# Patient Record
Sex: Female | Born: 1990
Health system: Southern US, Community
[De-identification: ages and names within clinical notes are randomized; demographics above are authoritative.]

## PROBLEM LIST (undated history)

## (undated) DIAGNOSIS — J45909 Unspecified asthma, uncomplicated: Secondary | ICD-10-CM

## (undated) DIAGNOSIS — D649 Anemia, unspecified: Secondary | ICD-10-CM

## (undated) HISTORY — PX: WISDOM TOOTH EXTRACTION: SHX21

## (undated) HISTORY — PX: NO PAST SURGERIES: SHX2092

---
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
## 2013-09-24 ENCOUNTER — Emergency Department (HOSPITAL_COMMUNITY): Payer: 59

## 2013-09-24 ENCOUNTER — Encounter (HOSPITAL_COMMUNITY): Payer: Self-pay | Admitting: Emergency Medicine

## 2013-09-24 ENCOUNTER — Emergency Department (HOSPITAL_COMMUNITY)
Admission: EM | Admit: 2013-09-24 | Discharge: 2013-09-24 | Disposition: A | Discharge status: Home / Self Care | Payer: 59 | Attending: Emergency Medicine | Admitting: Emergency Medicine

## 2013-09-24 DIAGNOSIS — Y9389 Activity, other specified: Secondary | ICD-10-CM | POA: Diagnosis not present

## 2013-09-24 DIAGNOSIS — S93401A Sprain of unspecified ligament of right ankle, initial encounter: Secondary | ICD-10-CM

## 2013-09-24 DIAGNOSIS — Y9289 Other specified places as the place of occurrence of the external cause: Secondary | ICD-10-CM | POA: Diagnosis not present

## 2013-09-24 DIAGNOSIS — S93409A Sprain of unspecified ligament of unspecified ankle, initial encounter: Secondary | ICD-10-CM | POA: Insufficient documentation

## 2013-09-24 DIAGNOSIS — S8990XA Unspecified injury of unspecified lower leg, initial encounter: Secondary | ICD-10-CM | POA: Insufficient documentation

## 2013-09-24 DIAGNOSIS — X500XXA Overexertion from strenuous movement or load, initial encounter: Secondary | ICD-10-CM | POA: Diagnosis not present

## 2013-09-24 DIAGNOSIS — S99919A Unspecified injury of unspecified ankle, initial encounter: Secondary | ICD-10-CM

## 2013-09-24 DIAGNOSIS — S99929A Unspecified injury of unspecified foot, initial encounter: Secondary | ICD-10-CM

## 2013-09-24 MED ORDER — HYDROCODONE-ACETAMINOPHEN 5-325 MG PO TABS
1.0000 | ORAL_TABLET | ORAL | Status: DC | PRN
Start: 2013-09-24 — End: 2014-11-24

## 2013-09-24 NOTE — ED Provider Notes (Signed)
CSN: [id number]     Arrival date & time 09/24/13  1529 History   First MD Initiated Contact with Patient 09/24/13 1719     Chief Complaint  Patient presents with  . Ankle Injury     (Consider location/radiation/quality/duration/timing/severity/associated sxs/prior Treatment) HPI  Kathryn Cardenas is a 23 y.o. female presenting with right ankle pain which occurred suddenly when the patient twisted her ankle while walking about 2 hours prior to arrival.  Pain is aching, constant and worse with palpation, movement and weight bearing.  The patient was able to weight bear immediately after the event.  There is no radiation of pain and the patient denies numbness distal to the injury site.  The patients treatment prior to arrival included ibuprofen with minimal improvement.  History reviewed. No pertinent past medical history. History reviewed. No pertinent past surgical history. No family history on file. History  Substance Use Topics  . Smoking status: Never Smoker   . Smokeless tobacco: Not on file  . Alcohol Use: No   OB History   Grav Para Term Preterm Abortions TAB SAB Ect Mult Living                 Review of Systems  Musculoskeletal: Positive for arthralgias and joint swelling.  Skin: Negative for wound.  Neurological: Negative for weakness and numbness.      Allergies  Review of patient's allergies indicates no known allergies.  Home Medications   Prior to Admission medications   Not on File   BP 145/90  Pulse 82  Temp(Src) 99.1 F (37.3 C) (Oral)  Resp 16  Ht 5\' 10"  (1.778 m)  Wt 255 lb (115.667 kg)  BMI 36.59 kg/m2  SpO2 99%  LMP 09/10/2013 Physical Exam  Nursing note and vitals reviewed. Constitutional: She appears well-developed and well-nourished.  HENT:  Head: Normocephalic.  Cardiovascular: Normal rate and intact distal pulses.  Exam reveals no decreased pulses.   Pulses:      Dorsalis pedis pulses are 2+ on the right side, and 2+ on the left  side.       Posterior tibial pulses are 2+ on the right side, and 2+ on the left side.  Musculoskeletal: She exhibits edema and tenderness.       Right ankle: She exhibits decreased range of motion, swelling and ecchymosis. She exhibits normal pulse. Tenderness. Lateral malleolus tenderness found. No head of 5th metatarsal and no proximal fibula tenderness found. Achilles tendon normal.  Neurological: She is alert. No sensory deficit.  Skin: Skin is warm, dry and intact.    ED Course  Procedures (including critical care time) Labs Review Labs Reviewed - No data to display  Imaging Review Dg Ankle Complete Right  09/24/2013   CLINICAL DATA:  Pain  EXAM: RIGHT ANKLE - COMPLETE 3+ VIEW  COMPARISON:  None.  FINDINGS: There is no evidence of fracture, dislocation, or joint effusion. There is no evidence of arthropathy or other focal bone abnormality. Soft tissues are unremarkable.  IMPRESSION: Negative.   Electronically Signed   By: Marlan Palau M.D.   On: 09/24/2013 16:43     EKG Interpretation None      MDM   Final diagnoses:  Ankle sprain, right, initial encounter   Patients labs and/or radiological studies were viewed and considered during the medical decision making and disposition process. Pt was fitted with aso,  Encouraged RICE.  Pt has crutches.  F/u with pcp in 7-10 days if not improved   Raynelle Fanning  Frady Taddeo, PA-C 09/24/13 1748

## 2013-09-24 NOTE — ED Notes (Signed)
Pt states she rolled her right ankle and [facility] 2 hours [facility].

## 2013-09-24 NOTE — Discharge Instructions (Signed)
Ankle Sprain °An ankle sprain is an injury to the strong, fibrous tissues (ligaments) that hold the bones of your ankle joint together.  °CAUSES °An ankle sprain is usually caused by a fall or by twisting your ankle. Ankle sprains most commonly occur when you step on the outer edge of your foot, and your ankle turns inward. People who participate in sports are more prone to these types of injuries.  °SYMPTOMS  °· Pain in your ankle. The pain may be present at rest or only when you are trying to stand or walk. °· Swelling. °· Bruising. Bruising may develop immediately or within 1 to 2 days after your injury. °· Difficulty standing or walking, particularly when turning corners or changing directions. °DIAGNOSIS  °Your caregiver will ask you details about your injury and perform a physical exam of your ankle to determine if you have an ankle sprain. During the physical exam, your caregiver will press on and apply pressure to specific areas of your foot and ankle. Your caregiver will try to move your ankle in certain ways. An X-ray exam may be done to be sure a bone was not broken or a ligament did not separate from one of the bones in your ankle (avulsion fracture).  °TREATMENT  °Certain types of braces can help stabilize your ankle. Your caregiver can make a recommendation for this. Your caregiver may recommend the use of medicine for pain. If your sprain is severe, your caregiver may refer you to a surgeon who helps to restore function to parts of your skeletal system (orthopedist) or a physical therapist. °HOME CARE INSTRUCTIONS  °· Apply ice to your injury for 1-2 days or as directed by your caregiver. Applying ice helps to reduce inflammation and pain. °¨ Put ice in a plastic bag. °¨ Place a towel between your skin and the bag. °¨ Leave the ice on for 15-20 minutes at a time, every 2 hours while you are awake. °· Only take over-the-counter or prescription medicines for pain, discomfort, or fever as directed by  your caregiver. °· Elevate your injured ankle above the level of your heart as much as possible for 2-3 days. °· If your caregiver recommends crutches, use them as instructed. Gradually put weight on the affected ankle. Continue to use crutches or a cane until you can walk without feeling pain in your ankle. °· If you have a plaster splint, wear the splint as directed by your caregiver. Do not rest it on anything harder than a pillow for the first 24 hours. Do not put weight on it. Do not get it wet. You may take it off to take a shower or bath. °· You may have been given an elastic bandage to wear around your ankle to provide support. If the elastic bandage is too tight (you have numbness or tingling in your foot or your foot becomes cold and blue), adjust the bandage to make it comfortable. °· If you have an air splint, you may blow more air into it or let air out to make it more comfortable. You may take your splint off at night and before taking a shower or bath. Wiggle your toes in the splint several times per day to decrease swelling. °SEEK MEDICAL CARE IF:  °· You have rapidly increasing bruising or swelling. °· Your toes feel extremely cold or you lose feeling in your foot. °· Your pain is not relieved with medicine. °SEEK IMMEDIATE MEDICAL CARE IF: °· Your toes are numb or blue. °·   You have severe pain that is increasing. MAKE SURE YOU:   Understand these instructions.  Will watch your condition.  Will get help right away if you are not doing well or get worse. Document Released: 01/23/2005 Document Revised: 10/18/2011 Document Reviewed: 02/04/2011 [facility] Patient Information 2015 [employer], [place]. This information is not intended to replace advice given to you by your health care provider. Make sure you discuss any questions you have with your health care provider.   Wear the ASO and use your crutches to avoid weight bearing until you can tolerate bearing weight without increased pain or  swelling.  Use ice and elevation as much as possible for the next several days to help reduce the swelling.    Use  Ibuprofen 600 mg (3 tablets of the 200mg  over the counter tablets) for  inflammation.  Call your doctor for a recheck of your injury in 7-10 days if your symptoms are not improving by then.  You may benefit from physical therapy of your ankle if it is not getting better over the next week.

## 2013-09-26 NOTE — ED Provider Notes (Signed)
Medical screening examination/treatment/procedure(s) were performed by non-physician practitioner and as supervising physician I was immediately available for consultation/collaboration.   EKG Interpretation None        Vanetta MuldersScott Sundeep Cary, MD 09/26/13 1549

## 2014-11-24 ENCOUNTER — Encounter (HOSPITAL_COMMUNITY): Payer: Self-pay | Admitting: *Deleted

## 2014-11-24 ENCOUNTER — Emergency Department (HOSPITAL_COMMUNITY): Payer: 59

## 2014-11-24 ENCOUNTER — Emergency Department (HOSPITAL_COMMUNITY)
Admission: EM | Admit: 2014-11-24 | Discharge: 2014-11-24 | Disposition: A | Discharge status: Home / Self Care | Payer: 59 | Attending: Emergency Medicine | Admitting: Emergency Medicine

## 2014-11-24 DIAGNOSIS — S6992XA Unspecified injury of left wrist, hand and finger(s), initial encounter: Secondary | ICD-10-CM | POA: Insufficient documentation

## 2014-11-24 DIAGNOSIS — Z3202 Encounter for pregnancy test, result negative: Secondary | ICD-10-CM | POA: Insufficient documentation

## 2014-11-24 DIAGNOSIS — S4992XA Unspecified injury of left shoulder and upper arm, initial encounter: Secondary | ICD-10-CM | POA: Insufficient documentation

## 2014-11-24 DIAGNOSIS — Y92410 Unspecified street and highway as the place of occurrence of the external cause: Secondary | ICD-10-CM | POA: Diagnosis not present

## 2014-11-24 DIAGNOSIS — Y998 Other external cause status: Secondary | ICD-10-CM | POA: Diagnosis not present

## 2014-11-24 DIAGNOSIS — Y9384 Activity, sleeping: Secondary | ICD-10-CM | POA: Insufficient documentation

## 2014-11-24 DIAGNOSIS — M79602 Pain in left arm: Secondary | ICD-10-CM

## 2014-11-24 DIAGNOSIS — S59902A Unspecified injury of left elbow, initial encounter: Secondary | ICD-10-CM | POA: Diagnosis not present

## 2014-11-24 DIAGNOSIS — J45909 Unspecified asthma, uncomplicated: Secondary | ICD-10-CM | POA: Diagnosis not present

## 2014-11-24 HISTORY — DX: Unspecified asthma, uncomplicated: J45.909

## 2014-11-24 LAB — POC URINE PREG, ED: Preg Test, Ur: NEGATIVE

## 2014-11-24 MED ORDER — IBUPROFEN 800 MG PO TABS: 800.0000 mg | ORAL_TABLET | Freq: Three times a day (TID) | ORAL | Status: DC

## 2014-11-24 MED ORDER — METHOCARBAMOL 500 MG PO TABS: 500.0000 mg | ORAL_TABLET | Freq: Three times a day (TID) | ORAL | Status: DC
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
## 2015-02-18 ENCOUNTER — Encounter: Payer: Self-pay | Admitting: Nurse Practitioner

## 2015-02-18 ENCOUNTER — Ambulatory Visit (INDEPENDENT_AMBULATORY_CARE_PROVIDER_SITE_OTHER): Payer: 59 | Admitting: Nurse Practitioner

## 2015-02-18 VITALS — BP 142/88 | Ht 70.0 in | Wt 257.0 lb

## 2015-02-18 DIAGNOSIS — J4520 Mild intermittent asthma, uncomplicated: Secondary | ICD-10-CM

## 2015-02-19 ENCOUNTER — Encounter: Payer: Self-pay | Admitting: Nurse Practitioner

## 2015-02-19 DIAGNOSIS — J4520 Mild intermittent asthma, uncomplicated: Secondary | ICD-10-CM | POA: Insufficient documentation
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
## 2015-03-09 DIAGNOSIS — H52229 Regular astigmatism, unspecified eye: Secondary | ICD-10-CM | POA: Diagnosis not present

## 2015-07-23 ENCOUNTER — Ambulatory Visit (INDEPENDENT_AMBULATORY_CARE_PROVIDER_SITE_OTHER): Payer: 59 | Admitting: Nurse Practitioner

## 2015-07-23 ENCOUNTER — Encounter: Payer: Self-pay | Admitting: Nurse Practitioner

## 2015-07-23 VITALS — BP 128/88 | Ht 70.0 in | Wt 264.2 lb

## 2015-07-23 DIAGNOSIS — N926 Irregular menstruation, unspecified: Secondary | ICD-10-CM | POA: Diagnosis not present

## 2015-07-23 MED ORDER — ALBUTEROL SULFATE HFA 108 (90 BASE) MCG/ACT IN AERS: 2.0000 | INHALATION_SPRAY | RESPIRATORY_TRACT | Status: DC | PRN

## 2015-07-23 NOTE — Progress Notes (Signed)
Subjective:  Presents for complaints of irregular menstrual cycles. Has been at least every other month. Never goes more than 2 months. Had a normal menstrual cycle June 10-14. Is not sexually active, defers need for contraceptive. Normal flow. No excessive cramping.  Objective:   BP 128/88 mmHg  Ht 5\' 10"  (1.778 m)  Wt 264 lb 4 oz (119.863 kg)  BMI 37.92 kg/m2 NAD. Alert, oriented. Lungs clear. Heart regular rate rhythm.  Assessment: Irregular menses  Plan: Since patient does not require contraceptive, denies symptoms of menorrhagia and dysmenorrhea, and cycles are occurring at least every other month further intervention is optional. This was discussed with patient. At this time she elects not to take any form of hormones. To call back if any problems. Also strongly encouraged preventive health physical.

## 2015-07-27 MED FILL — PROVENTIL HFA 90 MCG INH: 108 (90 BAS | 25 days supply | Qty: 7 | Fill #0
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
## 2018-08-17 ENCOUNTER — Other Ambulatory Visit: Payer: Self-pay | Admitting: Nurse Practitioner

## 2018-09-09 ENCOUNTER — Encounter: Payer: Self-pay | Admitting: Family Medicine

## 2018-09-09 MED ORDER — ALBUTEROL SULFATE HFA 108 (90 BASE) MCG/ACT IN AERS: 2.0000 | INHALATION_SPRAY | Freq: Four times a day (QID) | RESPIRATORY_TRACT | 0 refills | Status: DC | PRN

## 2018-09-09 NOTE — Telephone Encounter (Signed)
May have 1 refill of albuterol Please keep follow-up visit

## 2018-09-09 NOTE — Addendum Note (Signed)
Addended by: Dairl Ponder on: 09/09/2018 04:46 PM   Modules accepted: Orders

## 2018-10-25 ENCOUNTER — Other Ambulatory Visit: Payer: Self-pay | Admitting: *Deleted

## 2018-10-31 ENCOUNTER — Encounter: Payer: Self-pay | Admitting: Nurse Practitioner

## 2018-11-07 ENCOUNTER — Other Ambulatory Visit: Payer: Self-pay

## 2018-11-08 ENCOUNTER — Encounter: Payer: Self-pay | Admitting: Nurse Practitioner

## 2018-11-08 ENCOUNTER — Ambulatory Visit (INDEPENDENT_AMBULATORY_CARE_PROVIDER_SITE_OTHER): Payer: No Typology Code available for payment source | Admitting: Nurse Practitioner

## 2018-11-08 VITALS — BP 126/86 | Temp 97.6°F | Ht 68.5 in | Wt 252.8 lb

## 2018-11-08 DIAGNOSIS — Z23 Encounter for immunization: Secondary | ICD-10-CM

## 2018-11-08 DIAGNOSIS — Z124 Encounter for screening for malignant neoplasm of cervix: Secondary | ICD-10-CM

## 2018-11-08 DIAGNOSIS — Z1159 Encounter for screening for other viral diseases: Secondary | ICD-10-CM

## 2018-11-08 DIAGNOSIS — L83 Acanthosis nigricans: Secondary | ICD-10-CM

## 2018-11-08 DIAGNOSIS — Z01419 Encounter for gynecological examination (general) (routine) without abnormal findings: Secondary | ICD-10-CM

## 2018-11-08 DIAGNOSIS — Z113 Encounter for screening for infections with a predominantly sexual mode of transmission: Secondary | ICD-10-CM

## 2018-11-08 DIAGNOSIS — Z114 Encounter for screening for human immunodeficiency virus [HIV]: Secondary | ICD-10-CM

## 2018-11-08 MED ORDER — LO LOESTRIN FE 1 MG-10 MCG / 10 MCG PO TABS: 1.0000 | ORAL_TABLET | Freq: Every day | ORAL | 11 refills | Status: DC

## 2018-11-08 NOTE — Progress Notes (Signed)
Subjective:    Patient ID: Kathryn Cardenas, female    DOB: November 11, 1990, 28 y.o.   MRN: [id number]  HPI The patient comes in today for a wellness visit. Patient has no specific concerns. Pt got a new gym membership, trying to start exercising more. Has decreased her sugar intake, drinking more water. Sleeps 6-7 hours per day. Pt has gain some weight in the past few months. Pt having irregular cycles, has cycles every 1-2 months. No menorrhagia. LMP was mid-August. Pt is sexually active with one sexual partner. Uses condoms for birth control and STI protection. Regular eye and dental exam. Would like flu vaccine today.    A review of their health history was completed. A review of medications was also completed.  Any needed refills; none  Falls/  MVA accidents in past few months: no   Specialist pt sees on regular basis: none  Preventative health issues were discussed.   Additional concerns: none  Drug/Alcohol: Denies tobacco use. Drink 1 glass of alcohol during the weekend.   Review of Systems  Constitutional: Negative for activity change, appetite change, chills and fever.  HENT: Negative for ear pain, sinus pain and sneezing.   Respiratory: Negative for cough, shortness of breath and wheezing.   Cardiovascular: Negative for chest pain, palpitations and leg swelling.  Gastrointestinal: Negative for abdominal pain, blood in stool, diarrhea and vomiting.  Genitourinary: Positive for menstrual problem. Negative for difficulty urinating, dysuria, frequency, pelvic pain, vaginal bleeding and vaginal discharge.  Musculoskeletal: Negative for back pain and myalgias.  Neurological: Negative for dizziness, syncope and weakness.  Psychiatric/Behavioral: Negative for confusion.   Depression screen PHQ 2/9 11/08/2018  Decreased Interest 2  Down, Depressed, Hopeless 0  PHQ - 2 Score 2  Altered sleeping 0  Tired, decreased energy 1  Change in appetite 0  Feeling bad or failure about  yourself  0  Trouble concentrating 0  Moving slowly or fidgety/restless 0  Suicidal thoughts 0  PHQ-9 Score 3  Difficult doing work/chores Not difficult at all       Objective:   Physical Exam Exam conducted with a chaperone present.  Constitutional:      Appearance: Normal appearance.  Neck:     Thyroid: No thyroid mass or thyromegaly.  Cardiovascular:     Rate and Rhythm: Normal rate and regular rhythm.     Heart sounds: No murmur.  Pulmonary:     Effort: No respiratory distress.     Breath sounds: Normal breath sounds. No wheezing.  Chest:     Chest wall: No mass, lacerations or swelling.     Breasts:        Right: Normal. No swelling, bleeding, inverted nipple or mass.        Left: Normal. No swelling, bleeding, inverted nipple or mass.     Comments: Dense breast tissue Abdominal:     General: Abdomen is flat.     Palpations: There is no mass.     Tenderness: There is no abdominal tenderness. There is no rebound.     Hernia: No hernia is present.  Genitourinary:    Exam position: Lithotomy position.     Comments: External: No rashes or lesions present. Internal: No rashes or lesions present. Vagina has slight milky, clear discharge. No CMT. No palpable masses during bimanual exam. Exam limited due to abdominal girth. Lymphadenopathy:     Upper Body:     Right upper body: No supraclavicular, axillary or pectoral adenopathy.  Left upper body: No supraclavicular, axillary or pectoral adenopathy.  Skin:    General: Skin is warm and dry.     Comments: Slight darkening in the back of the neck  Neurological:     Mental Status: She is alert and oriented to person, place, and time.  Psychiatric:        Mood and Affect: Mood normal.        Behavior: Behavior normal.           Assessment & Plan:   Problem List Items Addressed This Visit      Musculoskeletal and Integument   Acanthosis nigricans     Other   Morbid obesity ([place])    Other Visit Diagnoses     Well woman exam    -  Primary   Relevant Orders   Pap IG and Chlamydia/Gonococcus, NAA   COMPLETE METABOLIC PANEL WITH GFR   Lipid Profile (Completed)   Hemoglobin A1c (Completed)   RPR (Completed)   Hepatitis C Antibody (Completed)   Flu Vaccine QUAD 6+ mos PF IM (Fluarix Quad PF) (Completed)   Screening for cervical cancer       Relevant Orders   Pap IG and Chlamydia/Gonococcus, NAA   Routine screening for STI (sexually transmitted infection)       Relevant Orders   Pap IG and Chlamydia/Gonococcus, NAA   HIV antibody (with reflex) (Completed)   COMPLETE METABOLIC PANEL WITH GFR   Lipid Profile (Completed)   Hemoglobin A1c (Completed)   RPR (Completed)   Hepatitis C Antibody (Completed)   Flu Vaccine QUAD 6+ mos PF IM (Fluarix Quad PF) (Completed)   Need for vaccination       Relevant Orders   Flu Vaccine QUAD 6+ mos PF IM (Fluarix Quad PF) (Completed)   Screening for HIV (human immunodeficiency virus)       Need for hepatitis C screening test       Relevant Orders   Hepatitis C Antibody (Completed)      -Recommended continue dietary and lifestyle modifications. Discussed safe sex practices. Start low-dose birth control to help control irregular menstrual cycles. Discussed risks associated with oc use.  Labs ordered. Further follow up based on results.   Return in about 1 year (around 11/08/2019) for physical.

## 2018-11-08 NOTE — Progress Notes (Signed)
   Subjective:    Patient ID: Kathryn Cardenas, female    DOB: December 14, 1990, 28 y.o.   MRN: [id number]  HPI The patient comes in today for a wellness visit.    A review of their health history was completed.  A review of medications was also completed.  Any needed refills; Albuterol   Eating habits: trying to be health conscious   Falls/  MVA accidents in past few months: no  Regular exercise:  Trying to   Specialist pt sees on regular basis: none  Preventative health issues were discussed.   Additional concerns: none   Review of Systems     Objective:   Physical Exam        Assessment & Plan:

## 2018-11-09 ENCOUNTER — Encounter: Payer: Self-pay | Admitting: Nurse Practitioner

## 2018-11-09 DIAGNOSIS — L83 Acanthosis nigricans: Secondary | ICD-10-CM | POA: Insufficient documentation

## 2018-11-09 LAB — HIV ANTIBODY (ROUTINE TESTING W REFLEX): HIV Screen 4th Generation wRfx: NONREACTIVE

## 2018-11-11 LAB — LIPID PANEL
Chol/HDL Ratio: 4.9 ratio — ABNORMAL HIGH (ref 0.0–4.4)
Cholesterol, Total: 196 mg/dL (ref 100–199)
HDL: 40 mg/dL (ref >39)
LDL Chol Calc (NIH): 129 mg/dL — ABNORMAL HIGH (ref 0–99)
Triglycerides: 150 mg/dL — ABNORMAL HIGH (ref 0–149)
VLDL Cholesterol Cal: 27 mg/dL (ref 5–40)

## 2018-11-11 LAB — RPR: RPR Ser Ql: NONREACTIVE

## 2018-11-11 LAB — HEMOGLOBIN A1C
Est. average glucose Bld gHb Est-mCnc: 108 mg/dL
Hgb A1c MFr Bld: 5.4 % (ref 4.8–5.6)

## 2018-11-11 LAB — HEPATITIS C ANTIBODY: Hep C Virus Ab: 0.1 s/co ratio (ref 0.0–0.9)

## 2018-11-14 ENCOUNTER — Encounter: Payer: Self-pay | Admitting: Nurse Practitioner

## 2018-11-15 ENCOUNTER — Other Ambulatory Visit: Payer: Self-pay | Admitting: Nurse Practitioner

## 2018-11-15 LAB — PAP IG AND CT-NG NAA
Chlamydia, Nuc. Acid Amp: NEGATIVE
Gonococcus by Nucleic Acid Amp: NEGATIVE

## 2018-11-15 MED ORDER — ALBUTEROL SULFATE HFA 108 (90 BASE) MCG/ACT IN AERS: 2.0000 | INHALATION_SPRAY | Freq: Four times a day (QID) | RESPIRATORY_TRACT | 0 refills | Status: DC | PRN

## 2018-11-19 ENCOUNTER — Encounter: Payer: Self-pay | Admitting: Nurse Practitioner

## 2018-11-20 ENCOUNTER — Other Ambulatory Visit: Payer: Self-pay | Admitting: Nurse Practitioner

## 2018-11-20 MED ORDER — NORETHINDRONE ACET-ETHINYL EST 1-20 MG-MCG PO TABS: 1.0000 | ORAL_TABLET | Freq: Every day | ORAL | 11 refills | Status: DC

## 2018-12-04 ENCOUNTER — Encounter: Payer: Self-pay | Admitting: Nurse Practitioner

## 2019-02-10 ENCOUNTER — Encounter: Payer: Self-pay | Admitting: Nurse Practitioner

## 2019-02-11 ENCOUNTER — Other Ambulatory Visit: Payer: 59

## 2019-02-11 NOTE — Telephone Encounter (Signed)
If the patient is not having any pain or discomfort this is not unusual.  But I would recommend that the patient give [place] another update in 4 to 6 weeks on how the next cycle goes Certainly if starting to have discomfort or pelvic pain to do a follow-up visit

## 2019-02-12 ENCOUNTER — Other Ambulatory Visit: Payer: Self-pay

## 2019-02-12 ENCOUNTER — Ambulatory Visit: Payer: No Typology Code available for payment source | Attending: Internal Medicine

## 2019-02-12 DIAGNOSIS — Z20822 Contact with and (suspected) exposure to covid-19: Secondary | ICD-10-CM | POA: Insufficient documentation

## 2019-02-14 LAB — NOVEL CORONAVIRUS, NAA: SARS-CoV-2, NAA: NOT DETECTED

## 2019-03-19 ENCOUNTER — Encounter: Payer: Self-pay | Admitting: Family Medicine

## 2019-03-19 ENCOUNTER — Other Ambulatory Visit: Payer: Self-pay

## 2019-03-19 ENCOUNTER — Ambulatory Visit (INDEPENDENT_AMBULATORY_CARE_PROVIDER_SITE_OTHER): Payer: No Typology Code available for payment source | Admitting: Family Medicine

## 2019-03-19 VITALS — BP 132/88 | Temp 98.2°F | Ht 69.0 in | Wt 244.0 lb

## 2019-03-19 DIAGNOSIS — K921 Melena: Secondary | ICD-10-CM | POA: Diagnosis not present

## 2019-03-19 DIAGNOSIS — D509 Iron deficiency anemia, unspecified: Secondary | ICD-10-CM

## 2019-03-19 LAB — POCT HEMOGLOBIN: Hemoglobin: 10.5 g/dL — AB (ref 11–14.6)

## 2019-03-19 NOTE — Progress Notes (Signed)
   Subjective:    Patient ID: Kathryn Cardenas, female    DOB: 1990/08/09, 29 y.o.   MRN: [id number]  HPIpt noticed blood on paper when wiping 6 days after having a bm. Then happened again yesterday and this morning.  Patient stated never had this before denies any pain for has intermittent hard bowel movements patient denies fatigue tiredness Results for orders placed or performed in visit on 03/19/19  POCT hemoglobin  Result Value Ref Range   Hemoglobin 10.5 (A) 11 - 14.6 g/dL   T rooReview of Systems    Denies abdominal pain does relate blood in stool intermittently the past couple days denies fevers chills vomiting denies shortness of breath or fatigue tiredness Objective:   Physical Exam Lungs clear respiratory normal heart regular no murmurs abdomen soft no guarding rebound tenderness rectal exam is normal  Heme-negative     Assessment & Plan:  Anemia check lab work Blood in stool referral to GI may will need scope I doubt colon cancer but it could be a colitis issue or possibly internal hemorrhoids gave patient print out on fiber in the diet encourage more vegetable fruit and water

## 2019-03-19 NOTE — Telephone Encounter (Signed)
Tried to call and no answer.  

## 2019-03-19 NOTE — Telephone Encounter (Signed)
Pt Called back and scheduled appt for today

## 2019-03-20 LAB — IRON AND TIBC
Iron Saturation: 10 % — ABNORMAL LOW (ref 15–55)
Iron: 38 ug/dL (ref 27–159)
Total Iron Binding Capacity: 394 ug/dL (ref 250–450)
UIBC: 356 ug/dL (ref 131–425)

## 2019-03-20 LAB — CBC WITH DIFFERENTIAL/PLATELET
Basophils Absolute: 0 10*3/uL (ref 0.0–0.2)
Basos: 1 %
EOS (ABSOLUTE): 0.3 10*3/uL (ref 0.0–0.4)
Eos: 6 %
Hematocrit: 33.9 % — ABNORMAL LOW (ref 34.0–46.6)
Hemoglobin: 10.6 g/dL — ABNORMAL LOW (ref 11.1–15.9)
Immature Grans (Abs): 0 10*3/uL (ref 0.0–0.1)
Immature Granulocytes: 0 %
Lymphocytes Absolute: 1.7 10*3/uL (ref 0.7–3.1)
Lymphs: 35 %
MCH: 26.8 pg (ref 26.6–33.0)
MCHC: 31.3 g/dL — ABNORMAL LOW (ref 31.5–35.7)
MCV: 86 fL (ref 79–97)
Monocytes Absolute: 0.5 10*3/uL (ref 0.1–0.9)
Monocytes: 11 %
Neutrophils Absolute: 2.3 10*3/uL (ref 1.4–7.0)
Neutrophils: 47 %
Platelets: 316 10*3/uL (ref 150–450)
RBC: 3.96 x10E6/uL (ref 3.77–5.28)
RDW: 14.7 % (ref 11.7–15.4)
WBC: 4.9 10*3/uL (ref 3.4–10.8)

## 2019-03-20 LAB — FERRITIN: Ferritin: 9 ng/mL — ABNORMAL LOW (ref 15–150)

## 2019-03-20 LAB — C-REACTIVE PROTEIN: CRP: 8 mg/L (ref 0–10)

## 2019-03-25 MED FILL — predniSONE 10 MG TABS: 10 | 5 days supply | Qty: 20 | Fill #0

## 2019-04-04 ENCOUNTER — Encounter: Payer: Self-pay | Admitting: Family Medicine

## 2019-04-08 ENCOUNTER — Encounter: Payer: Self-pay | Admitting: Internal Medicine

## 2019-05-06 ENCOUNTER — Ambulatory Visit: Payer: Self-pay | Admitting: Nurse Practitioner

## 2019-05-06 NOTE — Progress Notes (Deleted)
Primary Care Physician:  Babs Sciara, MD Primary Gastroenterologist:  Dr.   Bonnetta Barry chief complaint on file.   HPI:   Kathryn Cardenas is a 29 y.o. female who presents on referral from primary care for blood in stool and iron deficiency anemia.  Reviewed information provided with referral including office visit dated 03/19/2019.  At that time she denied abdominal pain but did have blood in the stool intermittently over the last couple days.  This was on the paper when wiping for least a couple episodes.  Never had rectal bleeding before, has intermittent hard bowel movements.  No overt fatigue.  Point-of-care hemoglobin found to be 10.5.  Abdominal exam essentially normal.  Recommended referral to GI for endoscopic evaluation.  No history of colonoscopy found in our system.  Today she states   Past Medical History:  Diagnosis Date  . Asthma     No past surgical history on file.  Current Outpatient Medications  Medication Sig Dispense Refill  . albuterol (VENTOLIN HFA) 108 (90 Base) MCG/ACT inhaler Inhale 2 puffs into the lungs every 6 (six) hours as needed. 18 g 0  . norethindrone-ethinyl estradiol (LOESTRIN) 1-20 MG-MCG tablet Take 1 tablet by mouth daily. 1 Package 11   No current facility-administered medications for this visit.    Allergies as of 05/06/2019 - Review Complete 11/09/2018  Allergen Reaction Noted  . Ibuprofen Hives 03/19/2019    No family history on file.  Social History   Socioeconomic History  . Marital status: Single    Spouse name: Not on file  . Number of children: Not on file  . Years of education: Not on file  . Highest education level: Not on file  Occupational History  . Not on file  Tobacco Use  . Smoking status: Never Smoker  . Smokeless tobacco: Never Used  Substance and Sexual Activity  . Alcohol use: Yes    Alcohol/week: 1.0 standard drinks    Types: 1 Standard drinks or equivalent per week    Comment: social drinker  . Drug  use: No  . Sexual activity: Yes    Birth control/protection: Condom  Other Topics Concern  . Not on file  Social History Narrative  . Not on file   Social Determinants of Health   Financial Resource Strain:   . Difficulty of Paying Living Expenses:   Food Insecurity:   . Worried About Programme researcher, broadcasting/film/video in the Last Year:   . Barista in the Last Year:   Transportation Needs:   . Freight forwarder (Medical):   Marland Kitchen Lack of Transportation (Non-Medical):   Physical Activity:   . Days of Exercise per Week:   . Minutes of Exercise per Session:   Stress:   . Feeling of Stress :   Social Connections:   . Frequency of Communication with Friends and Family:   . Frequency of Social Gatherings with Friends and Family:   . Attends Religious Services:   . Active Member of Clubs or Organizations:   . Attends Banker Meetings:   Marland Kitchen Marital Status:   Intimate Partner Violence:   . Fear of Current or Ex-Partner:   . Emotionally Abused:   Marland Kitchen Physically Abused:   . Sexually Abused:     Review of Systems: General: Negative for anorexia, weight loss, fever, chills, fatigue, weakness. Eyes: Negative for vision changes.  ENT: Negative for hoarseness, difficulty swallowing , nasal congestion. CV: Negative for chest pain,  angina, palpitations, dyspnea on exertion, peripheral edema.  Respiratory: Negative for dyspnea at rest, dyspnea on exertion, cough, sputum, wheezing.  GI: See history of present illness. GU:  Negative for dysuria, hematuria, urinary incontinence, urinary frequency, nocturnal urination.  MS: Negative for joint pain, low back pain.  Derm: Negative for rash or itching.  Neuro: Negative for weakness, abnormal sensation, seizure, frequent headaches, memory loss, confusion.  Psych: Negative for anxiety, depression, suicidal ideation, hallucinations.  Endo: Negative for unusual weight change.  Heme: Negative for bruising or bleeding. Allergy: Negative for  rash or hives.    Physical Exam: There were no vitals taken for this visit. General:   Alert and oriented. Pleasant and cooperative. Well-nourished and well-developed.  Head:  Normocephalic and atraumatic. Eyes:  Without icterus, sclera clear and conjunctiva pink.  Ears:  Normal auditory acuity. Mouth:  No deformity or lesions, oral mucosa pink.  Throat/Neck:  Supple, without mass or thyromegaly. Cardiovascular:  S1, S2 present without murmurs appreciated. Normal pulses noted. Extremities without clubbing or edema. Respiratory:  Clear to auscultation bilaterally. No wheezes, rales, or rhonchi. No distress.  Gastrointestinal:  +BS, soft, non-tender and non-distended. No HSM noted. No guarding or rebound. No masses appreciated.  Rectal:  Deferred  Musculoskalatal:  Symmetrical without gross deformities. Normal posture. Skin:  Intact without significant lesions or rashes. Neurologic:  Alert and oriented x4;  grossly normal neurologically. Psych:  Alert and cooperative. Normal mood and affect. Heme/Lymph/Immune: No significant cervical adenopathy. No excessive bruising noted.    05/06/2019 7:57 AM   Disclaimer: This note was dictated with voice recognition software. Similar sounding words can inadvertently be transcribed and may not be corrected upon review.

## 2019-05-13 ENCOUNTER — Encounter: Payer: Self-pay | Admitting: Nurse Practitioner

## 2019-05-13 ENCOUNTER — Ambulatory Visit: Payer: No Typology Code available for payment source | Admitting: Nurse Practitioner

## 2019-05-13 ENCOUNTER — Other Ambulatory Visit: Payer: Self-pay

## 2019-05-13 ENCOUNTER — Encounter: Payer: Self-pay | Admitting: *Deleted

## 2019-05-13 ENCOUNTER — Telehealth: Payer: Self-pay | Admitting: *Deleted

## 2019-05-13 ENCOUNTER — Ambulatory Visit: Payer: Self-pay | Admitting: Nurse Practitioner

## 2019-05-13 DIAGNOSIS — D649 Anemia, unspecified: Secondary | ICD-10-CM | POA: Insufficient documentation

## 2019-05-13 DIAGNOSIS — K625 Hemorrhage of anus and rectum: Secondary | ICD-10-CM | POA: Diagnosis not present

## 2019-05-13 MED ORDER — CLENPIQ 10-3.5-12 MG-GM -GM/160ML PO SOLN: 1.0000 | Freq: Once | ORAL | 0 refills | Status: AC

## 2019-05-13 NOTE — Assessment & Plan Note (Signed)
The patient had daily to every other day hematochezia in February for bowel 1 week, described as a little bit to moderate.  No constipation associated or rectal symptoms.  She is noted to be anemic as per below.  At this point given rectal bleeding we will proceed with a colonoscopy to further evaluate.  Differentials include benign anorectal source such as hemorrhoids, colitis, bleeding polyp, much less likely colorectal cancer.  Further recommendations to follow.  Proceed with TCS on propofol/MAC with Dr. Gala Romney in near future: the risks, benefits, and alternatives have been discussed with the patient in detail. The patient states understanding and desires to proceed.  The patient is not on any anticoagulants, anxiolytics, chronic pain medications, antidepressants, antidiabetics, or iron supplements.  Social alcohol, denies recreational drugs.  She is quite young age.  We will plan for the procedure on propofol/MAC to promote adequate sedation.

## 2019-05-13 NOTE — Telephone Encounter (Signed)
Called [facility] and spoke with Greig Castilla. PA required. I need to fax clinicals to [phone]. Clinicals faxed.

## 2019-05-13 NOTE — Patient Instructions (Addendum)
Your health issues we discussed today were:   Rectal bleeding with anemia: 1. We will schedule your colonoscopy for you 2. Further recommendations will be made based on the results of your colonoscopy 3. Continue your iron other than 1 week prior to your colonoscopy (as per your colonoscopy prep instructions) 4. Call us if you have any worsening or significant bleeding  Overall I recommend:  1. Continue your other current medications 2. Return for follow-up in 3 months 3. Call us if you have any questions or concerns   ---------------------------------------------------------------  COVID-19 Vaccine Information can be found at: [email or web address] For questions related to vaccine distribution or appointments, please email [email or web address] or call [phone].   ---------------------------------------------------------------   At [facility] we value your feedback. You may receive a survey about your visit today. Please share your experience as we strive to create trusting relationships with our patients to provide genuine, compassionate, quality care.  We appreciate your understanding and patience as we review any laboratory studies, imaging, and other diagnostic tests that are ordered as we care for you. Our office policy is 5 business days for review of these results, and any emergent or urgent results are addressed in a timely manner for your best interest. If you do not hear from our office in 1 week, please contact us.   We also encourage the use of MyChart, which contains your medical information for your review as well. If you are not enrolled in this feature, an access code is on this after visit summary for your convenience. Thank you for allowing [place] to be involved in your care.  It was great to see you today!  I hope you have a great summer!!

## 2019-05-13 NOTE — Progress Notes (Signed)
Primary Care Physician:  Babs Sciara, MD Primary Gastroenterologist:  Dr. Jena Gauss  Chief Complaint  Patient presents with  . Blood In Stools    hasn't seen any blood since February  . Anemia    HPI:   Kathryn Cardenas is a 29 y.o. female who presents on referral from primary care for blood in stool and IDA.  Reviewed information associated with referral including office visit dated 03/19/2019 for IDA and blood in stool.  At that time she noted intermittent hematochezia for the previous couple days without abdominal pain or other overt GI complaints.  No signs of severe anemia.  Noted to be heme-negative on exam.  Patient education provided related to fiber and recommended referral to GI for consideration of possible colonoscopy to rule out colitis or identify hemorrhoids or other etiology.  Labs completed by primary care 03/19/2019 found mild anemia with hemoglobin 10.6 which was mildly hypochromic but normocytic.  Platelets normal.  Ferritin significantly low at 9.  Iron normal, IVC normal, iron saturation low at 10%.  No history of colonoscopy in our system.  Today she states she's doing ok overall. Hasn't seen any hematochezia since February. She is on birth control and only has light bleeding 2-3 days a month. She is on iron per PCP right now. She had about 1 week of daily hematochezia to every other day hematochezia, associated with a bowel movement; on the tissue, on the stool and in the commode, described as "a little bit to moderate." No constipation. Denies GERD symptoms. Denies melena/dark stools. Denies abdominal pain, N/V, fever, chills. She did have about 10 lb unintentional weight loss at her last PCP OV associated with poor appetite. Denies URI or flu-like symptoms. Denies loss of sense of taste or smell. Denies chest pain, dyspnea, dizziness, lightheadedness, syncope, near syncope. Denies any other upper or lower GI symptoms.  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  Past Medical History:  Diagnosis Date  . Asthma     Past Surgical History:  Procedure Laterality Date  . NO PAST SURGERIES      Current Outpatient Medications  Medication Sig Dispense Refill  . albuterol (VENTOLIN HFA) 108 (90 Base) MCG/ACT inhaler Inhale 2 puffs into the lungs every 6 (six) hours as needed. 18 g 0  . norethindrone-ethinyl estradiol (LOESTRIN) 1-20 MG-MCG tablet Take 1 tablet by mouth daily. 1 Package 11   No current facility-administered medications for this visit.    Allergies as of 05/13/2019 - Review Complete 05/13/2019  Allergen Reaction Noted  . Ibuprofen Hives 03/19/2019    Family History  Problem Relation Age of Onset  . Colon cancer Neg Hx   . Gastric cancer Neg Hx   . Esophageal cancer Neg Hx     Social History   Socioeconomic History  . Marital status: Single    Spouse name: Not on file  . Number of children: Not on file  . Years of education: Not on file  . Highest education level: Not on file  Occupational History  . Not on file  Tobacco Use  . Smoking status: Never Smoker  . Smokeless tobacco: Never Used  Substance and Sexual Activity  . Alcohol use: Yes    Alcohol/week: 1.0 standard drinks    Types: 1 Standard drinks or equivalent per week    Comment: social drinker  . Drug use: No  . Sexual activity: Yes  Birth control/protection: Condom  Other Topics Concern  . Not on file  Social History Narrative  . Not on file   Social Determinants of Health   Financial Resource Strain:   . Difficulty of Paying Living Expenses:   Food Insecurity:   . Worried About Programme researcher, broadcasting/film/video in the Last Year:   . Barista in the Last Year:   Transportation Needs:   . Freight forwarder (Medical):   Marland Kitchen Lack of Transportation (Non-Medical):   Physical Activity:   . Days of Exercise per Week:   . Minutes of Exercise per Session:   Stress:   . Feeling of Stress :   Social  Connections:   . Frequency of Communication with Friends and Family:   . Frequency of Social Gatherings with Friends and Family:   . Attends Religious Services:   . Active Member of Clubs or Organizations:   . Attends Banker Meetings:   Marland Kitchen Marital Status:   Intimate Partner Violence:   . Fear of Current or Ex-Partner:   . Emotionally Abused:   Marland Kitchen Physically Abused:   . Sexually Abused:     Subjective: Review of Systems  Constitutional: Positive for weight loss. Negative for chills, fever and malaise/fatigue.  HENT: Negative for congestion and sore throat.   Respiratory: Negative for cough and shortness of breath.   Cardiovascular: Negative for chest pain and palpitations.  Gastrointestinal: Positive for blood in stool (None since Feb 2021). Negative for abdominal pain, diarrhea, melena, nausea and vomiting.  Musculoskeletal: Negative for joint pain and myalgias.  Skin: Negative for rash.  Neurological: Negative for dizziness and weakness.  Endo/Heme/Allergies: Does not bruise/bleed easily.  Psychiatric/Behavioral: Negative for depression. The patient is not nervous/anxious.   All other systems reviewed and are negative.      Objective: BP 132/87   Pulse 88   Temp (!) 97.1 F (36.2 C) (Temporal)   Ht 5\' 9"  (1.753 m)   Wt 235 lb 6.4 oz (106.8 kg)   LMP 05/06/2019 (Approximate)   BMI 34.76 kg/m  Physical Exam Vitals and nursing note reviewed.  Constitutional:      General: She is not in acute distress.    Appearance: Normal appearance. She is well-developed. She is obese. She is not ill-appearing, toxic-appearing or diaphoretic.  HENT:     Head: Normocephalic and atraumatic.     Nose: No congestion or rhinorrhea.  Eyes:     General: No scleral icterus. Cardiovascular:     Rate and Rhythm: Normal rate and regular rhythm.     Heart sounds: Normal heart sounds.  Pulmonary:     Effort: Pulmonary effort is normal. No respiratory distress.     Breath sounds:  Normal breath sounds.  Abdominal:     General: Abdomen is protuberant. Bowel sounds are normal.     Palpations: Abdomen is soft. There is no hepatomegaly, splenomegaly or mass.     Tenderness: There is no abdominal tenderness. There is no guarding or rebound.     Hernia: No hernia is present.  Skin:    General: Skin is warm and dry.     Coloration: Skin is not jaundiced.     Findings: No rash.  Neurological:     General: No focal deficit present.     Mental Status: She is alert and oriented to person, place, and time.  Psychiatric:        Attention and Perception: Attention normal.  Mood and Affect: Mood normal.        Speech: Speech normal.        Behavior: Behavior normal.        Thought Content: Thought content normal.        Cognition and Memory: Cognition and memory normal.       05/13/2019 11:10 AM   Disclaimer: This note was dictated with voice recognition software. Similar sounding words can inadvertently be transcribed and may not be corrected upon review.

## 2019-05-13 NOTE — Assessment & Plan Note (Signed)
Significant iron deficiency anemia as per HPI.  Primary care has started her on iron supplements.  She did have some persistent 1 week of hematochezia.  Denies upper GI symptoms or melena.  Menses is quite light per her description.  At this point we will proceed with a colonoscopy.  If no etiology is pinpointed we can consider further evaluation with endoscopy or even Givens capsule endoscopy if needed.  Further recommendations to follow.  Follow-up in our office in 3 months.

## 2019-05-13 NOTE — Progress Notes (Signed)
Cc'ed to pcp °

## 2019-05-15 NOTE — Telephone Encounter (Signed)
PA approved. Auth# [id number] dates 08/18/2019-09/18/2019

## 2019-07-21 ENCOUNTER — Ambulatory Visit: Payer: No Typology Code available for payment source | Attending: Internal Medicine

## 2019-07-21 DIAGNOSIS — Z23 Encounter for immunization: Secondary | ICD-10-CM

## 2019-07-21 NOTE — Progress Notes (Signed)
   Covid-19 Vaccination Clinic  Name:  Kathryn Cardenas    MRN: [id number] DOB: May 07, 1990  07/21/2019  [name] was observed post Covid-19 immunization for 15 minutes without incident. She was provided with Vaccine Information Sheet and instruction to access the V-Safe system.   [name] was instructed to call 911 with any severe reactions post vaccine: Marland Kitchen Difficulty breathing  . Swelling of face and throat  . A fast heartbeat  . A bad rash all over body  . Dizziness and weakness   Immunizations Administered    Name Date Dose VIS Date Route   Pfizer COVID-19 Vaccine 07/21/2019  2:21 PM 0.3 mL 04/02/2018 Intramuscular   Manufacturer: [employer], [employer]   Lot: [id number]   NDC: [address]-1

## 2019-08-13 ENCOUNTER — Ambulatory Visit: Payer: No Typology Code available for payment source | Admitting: Nurse Practitioner

## 2019-08-14 ENCOUNTER — Ambulatory Visit: Payer: No Typology Code available for payment source

## 2019-08-14 ENCOUNTER — Telehealth: Payer: Self-pay

## 2019-08-14 NOTE — Patient Instructions (Signed)
Kathryn Cardenas  08/14/2019     @PREFPERIOPPHARMACY @   Your procedure is scheduled on  08/18/2019 .  Report to 10/19/2019 at  1300 (1:00)  P.M.  Call this number if you have problems the morning of surgery:  [phone]   Remember:  Follow the diet and prep instructions given to you by Dr [phone] office.                      Take these medicines the morning of surgery with A SIP OF WATER  NONE. Use your inhaler before you come.    Do not wear jewelry, make-up or nail polish.  Do not wear lotions, powders, or perfumes. Please wear deodorant and brush your teeth.  Do not shave 48 hours prior to surgery.  Men may shave face and neck.  Do not bring valuables to the hospital.  [facility] is not responsible for any belongings or valuables.  Contacts, dentures or bridgework may not be worn into surgery.  Leave your suitcase in the car.  After surgery it may be brought to your room.  For patients admitted to the hospital, discharge time will be determined by your treatment team.  Patients discharged the day of surgery will not be allowed to drive home.   Name and phone number of your driver:   family Special instructions:  DO NOT smoke the morning of your procedure.  Please read over the following fact sheets that you were given. Anesthesia Post-op Instructions and Care and Recovery After Surgery       Colonoscopy, Adult, Care After This sheet gives you information about how to care for yourself after your procedure. Your health care provider may also give you more specific instructions. If you have problems or questions, contact your health care provider. What can I expect after the procedure? After the procedure, it is common to have:  A small amount of blood in your stool for 24 hours after the procedure.  Some gas.  Mild cramping or bloating of your abdomen. Follow these instructions at home: Eating and drinking   Drink enough fluid to keep your urine pale  yellow.  Follow instructions from your health care provider about eating or drinking restrictions.  Resume your normal diet as instructed by your health care provider. Avoid heavy or fried foods that are hard to digest. Activity  Rest as told by your health care provider.  Avoid sitting for a long time without moving. Get up to take short walks every 1-2 hours. This is important to improve blood flow and breathing. Ask for help if you feel weak or unsteady.  Return to your normal activities as told by your health care provider. Ask your health care provider what activities are safe for you. Managing cramping and bloating   Try walking around when you have cramps or feel bloated.  Apply heat to your abdomen as told by your health care provider. Use the heat source that your health care provider recommends, such as a moist heat pack or a heating pad. ? Place a towel between your skin and the heat source. ? Leave the heat on for 20-30 minutes. ? Remove the heat if your skin turns bright red. This is especially important if you are unable to feel pain, heat, or cold. You may have a greater risk of getting burned. General instructions  For the first 24 hours after the procedure: ? Do not drive  or use machinery. ? Do not sign important documents. ? Do not drink alcohol. ? Do your regular daily activities at a slower pace than normal. ? Eat soft foods that are easy to digest.  Take over-the-counter and prescription medicines only as told by your health care provider.  Keep all follow-up visits as told by your health care provider. This is important. Contact a health care provider if:  You have blood in your stool 2-3 days after the procedure. Get help right away if you have:  More than a small spotting of blood in your stool.  Large blood clots in your stool.  Swelling of your abdomen.  Nausea or vomiting.  A fever.  Increasing pain in your abdomen that is not relieved with  medicine. Summary  After the procedure, it is common to have a small amount of blood in your stool. You may also have mild cramping and bloating of your abdomen.  For the first 24 hours after the procedure, do not drive or use machinery, sign important documents, or drink alcohol.  Get help right away if you have a lot of blood in your stool, nausea or vomiting, a fever, or increased pain in your abdomen. This information is not intended to replace advice given to you by your health care provider. Make sure you discuss any questions you have with your health care provider. Document Revised: 08/19/2018 Document Reviewed: 08/19/2018 Elsevier Patient Education  2020 [employer] Monitored Anesthesia Care, Care After These instructions provide you with information about caring for yourself after your procedure. Your health care provider may also give you more specific instructions. Your treatment has been planned according to current medical practices, but problems sometimes occur. Call your health care provider if you have any problems or questions after your procedure. What can I expect after the procedure? After your procedure, you may:  Feel sleepy for several hours.  Feel clumsy and have poor balance for several hours.  Feel forgetful about what happened after the procedure.  Have poor judgment for several hours.  Feel nauseous or vomit.  Have a sore throat if you had a breathing tube during the procedure. Follow these instructions at home: For at least 24 hours after the procedure:      Have a responsible adult stay with you. It is important to have someone help care for you until you are awake and alert.  Rest as needed.  Do not: ? Participate in activities in which you could fall or become injured. ? Drive. ? Use heavy machinery. ? Drink alcohol. ? Take sleeping pills or medicines that cause drowsiness. ? Make important decisions or sign legal documents. ? Take care  of children on your own. Eating and drinking  Follow the diet that is recommended by your health care provider.  If you vomit, drink water, juice, or soup when you can drink without vomiting.  Make sure you have little or no nausea before eating solid foods. General instructions  Take over-the-counter and prescription medicines only as told by your health care provider.  If you have sleep apnea, surgery and certain medicines can increase your risk for breathing problems. Follow instructions from your health care provider about wearing your sleep device: ? Anytime you are sleeping, including during daytime naps. ? While taking prescription pain medicines, sleeping medicines, or medicines that make you drowsy.  If you smoke, do not smoke without supervision.  Keep all follow-up visits as told by your health care provider. This is  important. Contact a health care provider if:  You keep feeling nauseous or you keep vomiting.  You feel light-headed.  You develop a rash.  You have a fever. Get help right away if:  You have trouble breathing. Summary  For several hours after your procedure, you may feel sleepy and have poor judgment.  Have a responsible adult stay with you for at least 24 hours or until you are awake and alert. This information is not intended to replace advice given to you by your health care provider. Make sure you discuss any questions you have with your health care provider. Document Revised: 04/23/2017 Document Reviewed: 05/16/2015 Elsevier Patient Education  2020 [employer].

## 2019-08-14 NOTE — Telephone Encounter (Signed)
Called pt, she uses [facility] on [address]. Informed her rx was sent 05/13/19.

## 2019-08-14 NOTE — Telephone Encounter (Signed)
Pt called to inquire when her Prep would be sent to the pharmacy. Pt has a procedure on Monday 08/18/19. [facility] [address], [place].

## 2019-08-15 ENCOUNTER — Encounter (HOSPITAL_COMMUNITY): Payer: Self-pay

## 2019-08-15 ENCOUNTER — Other Ambulatory Visit (HOSPITAL_COMMUNITY)
Admission: RE | Admit: 2019-08-15 | Discharge: 2019-08-15 | Disposition: A | Discharge status: Home / Self Care | Payer: No Typology Code available for payment source | Source: Ambulatory Visit | Attending: Internal Medicine | Admitting: Internal Medicine

## 2019-08-15 ENCOUNTER — Other Ambulatory Visit: Payer: Self-pay

## 2019-08-15 ENCOUNTER — Encounter (HOSPITAL_COMMUNITY)
Admission: RE | Admit: 2019-08-15 | Discharge: 2019-08-15 | Disposition: A | Discharge status: Home / Self Care | Payer: No Typology Code available for payment source | Source: Ambulatory Visit | Attending: Internal Medicine | Admitting: Internal Medicine

## 2019-08-15 DIAGNOSIS — Z20822 Contact with and (suspected) exposure to covid-19: Secondary | ICD-10-CM | POA: Diagnosis not present

## 2019-08-15 DIAGNOSIS — Z01812 Encounter for preprocedural laboratory examination: Secondary | ICD-10-CM | POA: Insufficient documentation

## 2019-08-15 HISTORY — DX: Anemia, unspecified: D64.9

## 2019-08-15 LAB — CBC WITH DIFFERENTIAL/PLATELET
Abs Immature Granulocytes: 0 10*3/uL (ref 0.00–0.07)
Basophils Absolute: 0 10*3/uL (ref 0.0–0.1)
Basophils Relative: 1 %
Eosinophils Absolute: 0.3 10*3/uL (ref 0.0–0.5)
Eosinophils Relative: 4 %
HCT: 35 % — ABNORMAL LOW (ref 36.0–46.0)
Hemoglobin: 10.8 g/dL — ABNORMAL LOW (ref 12.0–15.0)
Immature Granulocytes: 0 %
Lymphocytes Relative: 32 %
Lymphs Abs: 1.8 10*3/uL (ref 0.7–4.0)
MCH: 28.5 pg (ref 26.0–34.0)
MCHC: 30.9 g/dL (ref 30.0–36.0)
MCV: 92.3 fL (ref 80.0–100.0)
Monocytes Absolute: 0.5 10*3/uL (ref 0.1–1.0)
Monocytes Relative: 9 %
Neutro Abs: 3.1 10*3/uL (ref 1.7–7.7)
Neutrophils Relative %: 54 %
Platelets: 336 10*3/uL (ref 150–400)
RBC: 3.79 MIL/uL — ABNORMAL LOW (ref 3.87–5.11)
RDW: 14 % (ref 11.5–15.5)
WBC: 5.7 10*3/uL (ref 4.0–10.5)
nRBC: 0 % (ref 0.0–0.2)

## 2019-08-15 LAB — HCG, SERUM, QUALITATIVE: Preg, Serum: NEGATIVE

## 2019-08-15 LAB — SARS CORONAVIRUS 2 (TAT 6-24 HRS): SARS Coronavirus 2: NEGATIVE

## 2019-08-18 ENCOUNTER — Ambulatory Visit (HOSPITAL_COMMUNITY): Payer: No Typology Code available for payment source | Admitting: Anesthesiology

## 2019-08-18 ENCOUNTER — Ambulatory Visit (HOSPITAL_COMMUNITY)
Admission: RE | Admit: 2019-08-18 | Discharge: 2019-08-18 | Disposition: A | Discharge status: Home / Self Care | Payer: No Typology Code available for payment source | Attending: Internal Medicine | Admitting: Internal Medicine

## 2019-08-18 ENCOUNTER — Encounter (HOSPITAL_COMMUNITY): Payer: Self-pay | Admitting: Internal Medicine

## 2019-08-18 ENCOUNTER — Encounter (HOSPITAL_COMMUNITY)
Admission: RE | Disposition: A | Discharge status: Home / Self Care | Payer: Self-pay | Source: Home / Self Care | Attending: Internal Medicine

## 2019-08-18 DIAGNOSIS — J45909 Unspecified asthma, uncomplicated: Secondary | ICD-10-CM | POA: Insufficient documentation

## 2019-08-18 DIAGNOSIS — Z888 Allergy status to other drugs, medicaments and biological substances status: Secondary | ICD-10-CM | POA: Insufficient documentation

## 2019-08-18 DIAGNOSIS — K921 Melena: Secondary | ICD-10-CM | POA: Insufficient documentation

## 2019-08-18 HISTORY — PX: COLONOSCOPY WITH PROPOFOL: SHX5780

## 2019-08-18 SURGERY — COLONOSCOPY WITH PROPOFOL
Anesthesia: General

## 2019-08-18 MED ORDER — PROPOFOL 500 MG/50ML IV EMUL
INTRAVENOUS | Status: DC | PRN
  Administered 2019-08-18: 150 ug/kg/min via INTRAVENOUS

## 2019-08-18 MED ORDER — PROPOFOL 10 MG/ML IV BOLUS
INTRAVENOUS | Status: AC
  Filled 2019-08-18: qty 40

## 2019-08-18 MED ORDER — LIDOCAINE 2% (20 MG/ML) 5 ML SYRINGE
INTRAMUSCULAR | Status: DC | PRN
Start: 2019-08-18 — End: 2019-08-18
  Administered 2019-08-18: 60 mg via INTRAVENOUS

## 2019-08-18 MED ORDER — LACTATED RINGERS IV SOLN
INTRAVENOUS | Status: DC | PRN
Start: 2019-08-18 — End: 2019-08-18

## 2019-08-18 MED ORDER — LACTATED RINGERS IV SOLN: INTRAVENOUS | Status: DC

## 2019-08-18 MED ORDER — PROPOFOL 10 MG/ML IV BOLUS
INTRAVENOUS | Status: DC | PRN
  Administered 2019-08-18 (×3): 40 mg via INTRAVENOUS

## 2019-08-18 NOTE — Op Note (Signed)
[facility] Patient Name: Kathryn Cardenas Procedure Date: 08/18/2019 2:50 PM MRN: [id number] Date of Birth: [birth year] Attending MD: Gennette Pac , MD CSN: [id number] Age: 29 Admit Type: Outpatient Procedure:                Colonoscopy Indications:              Hematochezia Providers:                Gennette Pac, MD, Dyann Ruddle, Jannett Celestine, RN Referring MD:              Medicines:                Propofol per Anesthesia Complications:            No immediate complications. Estimated Blood Loss:     Estimated blood loss: none. Procedure:                After obtaining informed consent, the colonoscope                            was passed under direct vision. Throughout the                            procedure, the patient's blood pressure, pulse, and                            oxygen saturations were monitored continuously. The                            CF-[id number] ([id number]) scope was introduced through                            the anus and advanced to the the cecum, identified                            by appendiceal orifice and ileocecal valve. Scope In: 3:22:47 PM Scope Out: 3:33:05 PM Scope Withdrawal Time: 0 hours 6 minutes 29 seconds  Total Procedure Duration: 0 hours 10 minutes 18 seconds  Findings:      The perianal and digital rectal examinations were normal.      The colon (entire examined portion) appeared normal. Distal 10 cm of TI       appeared normal. Impression:               - The entire examined colon is normal.                           - No specimens collected. I suspect trivial benign                            anorectal bleeding which is resolved Moderate Sedation:      Moderate (conscious) sedation was personally administered by an       anesthesia professional. The following parameters were monitored: oxygen       saturation, heart rate, blood pressure, respiratory rate, EKG, adequacy  of pulmonary  ventilation, and response to care. Recommendation:           - Patient has a contact number available for                            emergencies. The signs and symptoms of potential                            delayed complications were discussed with the                            patient. Return to normal activities tomorrow.                            Written discharge instructions were provided to the                            patient.                           - Advance diet as tolerated. Call if bleeding                            recurs. Otherwise, repeat colonoscopy at age 72 for                            colorectal cancer screening. Procedure Code(s):        --- Professional ---                           [phone], Colonoscopy, flexible; diagnostic, including                            collection of specimen(s) by brushing or washing,                            when performed (separate procedure) Diagnosis Code(s):        --- Professional ---                           K92.1, Melena (includes Hematochezia) CPT copyright 2019 American Medical Association. All rights reserved. The codes documented in this report are preliminary and upon coder review may  be revised to meet current compliance requirements. Gerrit Friends. Rhyann Berton, MD Gennette Pac, MD 08/18/2019 3:43:33 PM This report has been signed electronically. Number of Addenda: 0

## 2019-08-18 NOTE — Anesthesia Postprocedure Evaluation (Signed)
Anesthesia Post Note  Patient: Kathryn Cardenas  Procedure(s) Performed: COLONOSCOPY WITH PROPOFOL (N/A )  Patient location during evaluation: PACU Anesthesia Type: MAC Level of consciousness: awake, awake and alert, oriented and patient cooperative Pain management: pain level controlled Vital Signs Assessment: post-procedure vital signs reviewed and stable Respiratory status: spontaneous breathing and respiratory function stable Cardiovascular status: blood pressure returned to baseline and stable Anesthetic complications: no   No complications documented.   Last Vitals:  Vitals:   08/18/19 1350  BP: (!) 137/91  Pulse: 71  Resp: 14  Temp: 36.8 C  SpO2: 98%    Last Pain:  Vitals:   08/18/19 1350  PainSc: 0-No pain                 Russel Morain L

## 2019-08-18 NOTE — Anesthesia Preprocedure Evaluation (Signed)
Anesthesia Evaluation  Patient identified by MRN, date of birth, ID band Patient awake    Reviewed: Allergy & Precautions, H&P , NPO status , Patient's Chart, lab work & pertinent test results, reviewed documented beta blocker date and time   Airway Mallampati: II  TM Distance: >3 FB Neck ROM: full    Dental no notable dental hx.    Pulmonary asthma ,    Pulmonary exam normal breath sounds clear to auscultation       Cardiovascular Exercise Tolerance: Good negative cardio ROS   Rhythm:regular Rate:Normal     Neuro/Psych negative neurological ROS  negative psych ROS   GI/Hepatic negative GI ROS, Neg liver ROS,   Endo/Other  negative endocrine ROS  Renal/GU negative Renal ROS  negative genitourinary   Musculoskeletal   Abdominal   Peds  Hematology  (+) Blood dyscrasia, anemia ,   Anesthesia Other Findings   Reproductive/Obstetrics negative OB ROS                             Anesthesia Physical Anesthesia Plan  ASA: II  Anesthesia Plan: General   Post-op Pain Management:    Induction:   PONV Risk Score and Plan: 3 and Propofol infusion  Airway Management Planned:   Additional Equipment:   Intra-op Plan:   Post-operative Plan:   Informed Consent: I have reviewed the patients History and Physical, chart, labs and discussed the procedure including the risks, benefits and alternatives for the proposed anesthesia with the patient or authorized representative who has indicated his/her understanding and acceptance.     Dental Advisory Given  Plan Discussed with: CRNA  Anesthesia Plan Comments:         Anesthesia Quick Evaluation

## 2019-08-18 NOTE — Discharge Instructions (Signed)
Colonoscopy Discharge Instructions  Read the instructions outlined below and refer to this sheet in the next few weeks. These discharge instructions provide you with general information on caring for yourself after you leave the hospital. Your doctor may also give you specific instructions. While your treatment has been planned according to the most current medical practices available, unavoidable complications occasionally occur. If you have any problems or questions after discharge, call Dr. Jena Gauss at [phone]. ACTIVITY  You may resume your regular activity, but move at a slower pace for the next 24 hours.   Take frequent rest periods for the next 24 hours.   Walking will help get rid of the air and reduce the bloated feeling in your belly (abdomen).   No driving for 24 hours (because of the medicine (anesthesia) used during the test).    Do not sign any important legal documents or operate any machinery for 24 hours (because of the anesthesia used during the test).  NUTRITION  Drink plenty of fluids.   You may resume your normal diet as instructed by your doctor.   Begin with a light meal and progress to your normal diet. Heavy or fried foods are harder to digest and may make you feel sick to your stomach (nauseated).   Avoid alcoholic beverages for 24 hours or as instructed.  MEDICATIONS  You may resume your normal medications unless your doctor tells you otherwise.  WHAT YOU CAN EXPECT TODAY  Some feelings of bloating in the abdomen.   Passage of more gas than usual.   Spotting of blood in your stool or on the toilet paper.  IF YOU HAD POLYPS REMOVED DURING THE COLONOSCOPY:  No aspirin products for 7 days or as instructed.   No alcohol for 7 days or as instructed.   Eat a soft diet for the next 24 hours.  FINDING OUT THE RESULTS OF YOUR TEST Not all test results are available during your visit. If your test results are not back during the visit, make an appointment  with your caregiver to find out the results. Do not assume everything is normal if you have not heard from your caregiver or the medical facility. It is important for you to follow up on all of your test results.  SEEK IMMEDIATE MEDICAL ATTENTION IF:  You have more than a spotting of blood in your stool.   Your belly is swollen (abdominal distention).   You are nauseated or vomiting.   You have a temperature over 101.   You have abdominal pain or discomfort that is severe or gets worse throughout the day.   Your colonoscopy was completely normal.  I suspect trivial rectal bleeding  As long as bleeding does not recur, no further evaluation is needed  You should have a repeat colonoscopy for colon cancer screening at age 29  Call me if you have any recurrent bleeding  At patient request I called Shamika Balow at [phone] -reviewed results      Monitored Anesthesia Care, Care After These instructions provide you with information about caring for yourself after your procedure. Your health care provider may also give you more specific instructions. Your treatment has been planned according to current medical practices, but problems sometimes occur. Call your health care provider if you have any problems or questions after your procedure. What can I expect after the procedure? After your procedure, you may:  Feel sleepy for several hours.  Feel clumsy and have poor balance for several hours.  Feel forgetful about what happened after the procedure.  Have poor judgment for several hours.  Feel nauseous or vomit.  Have a sore throat if you had a breathing tube during the procedure. Follow these instructions at home: For at least 24 hours after the procedure:      Have a responsible adult stay with you. It is important to have someone help care for you until you are awake and alert.  Rest as needed.  Do not: ? Participate in activities in which you could fall or  become injured. ? Drive. ? Use heavy machinery. ? Drink alcohol. ? Take sleeping pills or medicines that cause drowsiness. ? Make important decisions or sign legal documents. ? Take care of children on your own. Eating and drinking  Follow the diet that is recommended by your health care provider.  If you vomit, drink water, juice, or soup when you can drink without vomiting.  Make sure you have little or no nausea before eating solid foods. General instructions  Take over-the-counter and prescription medicines only as told by your health care provider.  If you have sleep apnea, surgery and certain medicines can increase your risk for breathing problems. Follow instructions from your health care provider about wearing your sleep device: ? Anytime you are sleeping, including during daytime naps. ? While taking prescription pain medicines, sleeping medicines, or medicines that make you drowsy.  If you smoke, do not smoke without supervision.  Keep all follow-up visits as told by your health care provider. This is important. Contact a health care provider if:  You keep feeling nauseous or you keep vomiting.  You feel light-headed.  You develop a rash.  You have a fever. Get help right away if:  You have trouble breathing. Summary  For several hours after your procedure, you may feel sleepy and have poor judgment.  Have a responsible adult stay with you for at least 24 hours or until you are awake and alert. This information is not intended to replace advice given to you by your health care provider. Make sure you discuss any questions you have with your health care provider. Document Revised: 04/23/2017 Document Reviewed: 05/16/2015 Elsevier Patient Education  2020 [employer].

## 2019-08-18 NOTE — H&P (Signed)
@LOGO @   Primary Care Physician:  , MD Primary Gastroenterologist:  Dr. Babs Sciara  Pre-Procedure History & Physical: HPI:  Kathryn Cardenas is a 29 y.o. female here for diagnostic colonoscopy to further evaluate paper hematochezia earlier in the year.  Delay evaluation because of the pandemic.  Patient states over the past couple of months bleeding has resolved and she is got no lower GI tract symptoms.  Past Medical History:  Diagnosis Date  . Anemia   . Asthma     Past Surgical History:  Procedure Laterality Date  . NO PAST SURGERIES    . WISDOM TOOTH EXTRACTION      Prior to Admission medications   Medication Sig Start Date End Date Taking? Authorizing Provider  albuterol (VENTOLIN HFA) 108 (90 Base) MCG/ACT inhaler Inhale 2 puffs into the lungs every 6 (six) hours as needed. Patient taking differently: Inhale 2 puffs into the lungs every 6 (six) hours as needed for wheezing or shortness of breath.  11/15/18  Yes 01/15/19, NP  ferrous sulfate 325 (65 FE) MG tablet Take 325 mg by mouth daily with breakfast.   Yes [provider]  Multiple Vitamins-Minerals (MULTIVITAMIN WITH MINERALS) tablet Take 1 tablet by mouth daily. Woman's Daily   Yes [provider]  norethindrone-ethinyl estradiol (LOESTRIN) 1-20 MG-MCG tablet Take 1 tablet by mouth daily. 11/20/18  Yes 11/22/18, NP    Allergies as of 05/13/2019 - Review Complete 05/13/2019  Allergen Reaction Noted  . Ibuprofen Hives 03/19/2019    Family History  Problem Relation Age of Onset  . Colon cancer Neg Hx   . Gastric cancer Neg Hx   . Esophageal cancer Neg Hx     Social History   Socioeconomic History  . Marital status: Single    Spouse name: Not on file  . Number of children: Not on file  . Years of education: Not on file  . Highest education level: Not on file  Occupational History  . Not on file  Tobacco Use  . Smoking status: Never Smoker  . Smokeless  tobacco: Never Used  Vaping Use  . Vaping Use: Never used  Substance and Sexual Activity  . Alcohol use: Yes    Alcohol/week: 1.0 standard drink    Types: 1 Standard drinks or equivalent per week    Comment: social drinker  . Drug use: No  . Sexual activity: Yes    Birth control/protection: Condom  Other Topics Concern  . Not on file  Social History Narrative  . Not on file   Social Determinants of Health   Financial Resource Strain:   . Difficulty of Paying Living Expenses:   Food Insecurity:   . Worried About 05/17/2019 in the Last Year:   . Programme researcher, broadcasting/film/video in the Last Year:   Transportation Needs:   . Barista (Medical):   Freight forwarder Lack of Transportation (Non-Medical):   Physical Activity:   . Days of Exercise per Week:   . Minutes of Exercise per Session:   Stress:   . Feeling of Stress :   Social Connections:   . Frequency of Communication with Friends and Family:   . Frequency of Social Gatherings with Friends and Family:   . Attends Religious Services:   . Active Member of Clubs or Organizations:   . Attends [employer] Meetings:   Banker Marital Status:   Intimate Partner Violence:   . Fear of Current  or Ex-Partner:   . Emotionally Abused:   [employer] Physically Abused:   . Sexually Abused:     Review of Systems: See HPI, otherwise negative ROS  Physical Exam: BP (!) 137/91 (BP Location: Left Arm)   Pulse 71   Temp 98.3 F (36.8 C)   Resp 14   LMP 08/15/2019   SpO2 98%  General:   Alert,  Well-developed, well-nourished, pleasant and cooperative in NAD Mouth:  No deformity or lesions. Neck:  Supple; no masses or thyromegaly. No significant cervical adenopathy. Lungs:  Clear throughout to auscultation.   No wheezes, crackles, or rhonchi. No acute distress. Heart:  Regular rate and rhythm; no murmurs, clicks, rubs,  or gallops. Abdomen: Non-distended, normal bowel sounds.  Soft and nontender without appreciable mass or hepatosplenomegaly.   Pulses:  Normal pulses noted. Extremities:  Without clubbing or edema.  Impression/Plan: 29 year old lady with paper hematochezia.  She is devoid of any other GI symptoms.  In fact, bleeding is resolved at least over the past couple of months.  No family history of colon polyps or cancer as far she knows.  Diagnostic colonoscopy today per plan.  The risks, benefits, limitations, alternatives and imponderables have been reviewed with the patient. Questions have been answered. All parties are agreeable.      Notice: This dictation was prepared with Dragon dictation along with smaller phrase technology. Any transcriptional errors that result from this process are unintentional and may not be corrected upon review.

## 2019-08-18 NOTE — Transfer of Care (Signed)
Immediate Anesthesia Transfer of Care Note  Patient: Kathryn Cardenas  Procedure(s) Performed: COLONOSCOPY WITH PROPOFOL (N/A )  Patient Location: PACU  Anesthesia Type:MAC  Level of Consciousness: awake, alert , oriented and patient cooperative  Airway & Oxygen Therapy: Patient Spontanous Breathing  Post-op Assessment: Report given to RN and Post -op Vital signs reviewed and stable  Post vital signs: Reviewed and stable  Last Vitals:  Vitals Value Taken Time  BP 100/63   Temp    Pulse 79 08/18/19 1537  Resp 19 08/18/19 1537  SpO2 100 % 08/18/19 1537  Vitals shown include unvalidated device data.  Last Pain:  Vitals:   08/18/19 1350  PainSc: 0-No pain         Complications: No complications documented.

## 2019-08-25 ENCOUNTER — Encounter (HOSPITAL_COMMUNITY): Payer: Self-pay | Admitting: Internal Medicine

## 2019-09-22 ENCOUNTER — Telehealth: Payer: Self-pay

## 2019-09-22 NOTE — Telephone Encounter (Signed)
Received fax from [employer] requesting TCS result to complete certification process. Initial in blank space if no abnormalities noted. Initialed space and returned fax to [employer].

## 2019-10-02 ENCOUNTER — Ambulatory Visit: Payer: No Typology Code available for payment source | Admitting: Nurse Practitioner

## 2019-10-17 ENCOUNTER — Other Ambulatory Visit: Payer: Self-pay | Admitting: Nurse Practitioner

## 2019-12-26 ENCOUNTER — Other Ambulatory Visit: Payer: Self-pay | Admitting: Nurse Practitioner

## 2019-12-30 NOTE — Telephone Encounter (Signed)
Needs wellness. Last one oct 2020. Then route back to nurses

## 2019-12-30 NOTE — Telephone Encounter (Signed)
Sent my chart message to schedule appointment.

## 2020-01-09 ENCOUNTER — Other Ambulatory Visit: Payer: Self-pay | Admitting: Nurse Practitioner

## 2020-01-09 NOTE — Telephone Encounter (Signed)
Made appt for 12-22

## 2020-01-28 ENCOUNTER — Ambulatory Visit: Payer: No Typology Code available for payment source | Admitting: Family Medicine

## 2020-02-05 ENCOUNTER — Encounter: Payer: Self-pay | Admitting: Family Medicine

## 2020-02-20 ENCOUNTER — Ambulatory Visit: Payer: No Typology Code available for payment source | Admitting: Family Medicine

## 2020-02-26 ENCOUNTER — Other Ambulatory Visit: Payer: No Typology Code available for payment source

## 2020-03-05 ENCOUNTER — Encounter: Payer: Self-pay | Admitting: Family Medicine

## 2020-03-05 ENCOUNTER — Ambulatory Visit (INDEPENDENT_AMBULATORY_CARE_PROVIDER_SITE_OTHER): Payer: No Typology Code available for payment source | Admitting: Family Medicine

## 2020-03-05 ENCOUNTER — Other Ambulatory Visit: Payer: Self-pay

## 2020-03-05 VITALS — BP 112/74 | Temp 97.6°F | Ht 70.0 in | Wt 224.6 lb

## 2020-03-05 DIAGNOSIS — D508 Other iron deficiency anemias: Secondary | ICD-10-CM

## 2020-03-05 MED ORDER — NORETHINDRONE ACET-ETHINYL EST 1-20 MG-MCG PO TABS: 1.0000 | ORAL_TABLET | Freq: Every day | ORAL | 3 refills | Status: DC

## 2020-03-05 NOTE — Progress Notes (Signed)
   Subjective:    Patient ID: Kathryn Cardenas, female    DOB: [birth year], 30 y.o.   MRN: [id number]  HPI  Patient arrives for contraception follow up-Loestrin. Patient needs refills on BCP. Patient overall doing fairly well taking the Loestrin on a regular basis occasionally has heavy cycles but otherwise is doing well is not sexually active currently.  Not having any abdominal pain. Review of Systems  Constitutional: Negative for activity change, appetite change and fatigue.  HENT: Negative for congestion.   Respiratory: Negative for cough.   Cardiovascular: Negative for chest pain.  Gastrointestinal: Negative for abdominal pain.  Skin: Negative for color change.  Neurological: Negative for headaches.  Psychiatric/Behavioral: Negative for behavioral problems.       Objective:   Physical Exam Vitals reviewed.  Constitutional:      Appearance: She is well-developed and well-nourished.  HENT:     Head: Normocephalic.  Cardiovascular:     Rate and Rhythm: Normal rate and regular rhythm.     Heart sounds: Normal heart sounds. No murmur heard.   Pulmonary:     Effort: Pulmonary effort is normal.     Breath sounds: Normal breath sounds.  Skin:    General: Skin is warm and dry.  Neurological:     Mental Status: She is alert.  Psychiatric:        Mood and Affect: Mood and affect normal.      Patient has history of iron deficient anemia at times feels fatigued tired other times feels pretty good denies any rectal bleeding does not feel she has been having excessive bleeding lately in regards to her cycles      Assessment & Plan:  She does have history of iron deficiency anemia we will do lab work  Birth control pills overall doing well continue for an additional year on her next visit already with the nurse practitioner for Pap smear pelvic exam that would be in approximately 1 year  Follow-up sooner if any problems

## 2021-01-12 ENCOUNTER — Encounter: Payer: Self-pay | Admitting: Family Medicine

## 2021-02-07 ENCOUNTER — Other Ambulatory Visit: Payer: Self-pay | Admitting: Nurse Practitioner

## 2021-02-09 MED ORDER — ALBUTEROL SULFATE HFA 108 (90 BASE) MCG/ACT IN AERS: 2.0000 | INHALATION_SPRAY | Freq: Four times a day (QID) | RESPIRATORY_TRACT | 0 refills | Status: DC | PRN

## 2021-03-02 ENCOUNTER — Other Ambulatory Visit: Payer: Self-pay

## 2021-03-02 MED ORDER — NORETHINDRONE ACET-ETHINYL EST 1-20 MG-MCG PO TABS: 1.0000 | ORAL_TABLET | Freq: Every day | ORAL | 0 refills | Status: DC

## 2021-03-07 ENCOUNTER — Ambulatory Visit: Payer: No Typology Code available for payment source | Admitting: Nurse Practitioner

## 2021-03-09 ENCOUNTER — Ambulatory Visit: Payer: No Typology Code available for payment source | Admitting: Nurse Practitioner

## 2021-03-10 ENCOUNTER — Other Ambulatory Visit: Payer: Self-pay

## 2021-03-10 ENCOUNTER — Encounter: Payer: Self-pay | Admitting: Nurse Practitioner

## 2021-03-10 ENCOUNTER — Ambulatory Visit (INDEPENDENT_AMBULATORY_CARE_PROVIDER_SITE_OTHER): Payer: No Typology Code available for payment source | Admitting: Nurse Practitioner

## 2021-03-10 VITALS — BP 123/91 | HR 69 | Temp 97.9°F | Ht 70.0 in | Wt 228.4 lb

## 2021-03-10 DIAGNOSIS — Z3041 Encounter for surveillance of contraceptive pills: Secondary | ICD-10-CM | POA: Diagnosis not present

## 2021-03-10 MED ORDER — NORETHINDRONE ACET-ETHINYL EST 1-20 MG-MCG PO TABS: 1.0000 | ORAL_TABLET | Freq: Every day | ORAL | 11 refills | Status: DC

## 2021-03-10 NOTE — Progress Notes (Signed)
° °  Subjective:    Patient ID: Kathryn Cardenas, female    DOB: September 23, 1990, 31 y.o.   MRN: [id number]  HPI Patient here to get refill on the Loestrin.  Patient has been taking Loestrin without difficulty.  Patient denies patient denies any new headaches, shortness of breath, leg swelling/pain.  Patient has history of anemia.  Patient denies any signs symptoms of anemia.  Patient denies any blood in her stool.  Last Pap was October 2020    Review of Systems  All other systems reviewed and are negative.     Objective:   Physical Exam Constitutional:      General: She is not in acute distress.    Appearance: Normal appearance. She is obese. She is not ill-appearing or toxic-appearing.  HENT:     Head: Normocephalic.  Cardiovascular:     Rate and Rhythm: Normal rate and regular rhythm.     Pulses: Normal pulses.     Heart sounds: Normal heart sounds. No murmur heard. Pulmonary:     Effort: Pulmonary effort is normal. No respiratory distress.     Breath sounds: No wheezing.  Skin:    General: Skin is warm and dry.  Neurological:     General: No focal deficit present.     Mental Status: She is alert and oriented to person, place, and time.  Psychiatric:        Mood and Affect: Mood normal.        Behavior: Behavior normal.          Assessment & Plan:   1. Encounter for surveillance of contraceptive pills -Loestrin refilled for 12 months. -Patient encouraged to follow-up for routine wellness exam and Pap in 8 months. -Last Pap was October 2020 -Side effects of combined oral contraceptives discussed in detail.  Patient has no questions.

## 2021-09-09 ENCOUNTER — Other Ambulatory Visit: Payer: Self-pay | Admitting: Family Medicine

## 2021-09-09 MED ORDER — ALBUTEROL SULFATE HFA 108 (90 BASE) MCG/ACT IN AERS: 2.0000 | INHALATION_SPRAY | Freq: Four times a day (QID) | RESPIRATORY_TRACT | 0 refills | Status: DC | PRN

## 2021-11-14 ENCOUNTER — Ambulatory Visit: Payer: No Typology Code available for payment source | Admitting: Nurse Practitioner

## 2021-11-17 ENCOUNTER — Encounter: Payer: Self-pay | Admitting: Nurse Practitioner

## 2021-11-17 ENCOUNTER — Ambulatory Visit (INDEPENDENT_AMBULATORY_CARE_PROVIDER_SITE_OTHER): Payer: No Typology Code available for payment source | Admitting: Nurse Practitioner

## 2021-11-17 VITALS — BP 134/84 | HR 71 | Temp 97.9°F | Ht 68.5 in | Wt 233.6 lb

## 2021-11-17 DIAGNOSIS — Z124 Encounter for screening for malignant neoplasm of cervix: Secondary | ICD-10-CM

## 2021-11-17 DIAGNOSIS — Z01419 Encounter for gynecological examination (general) (routine) without abnormal findings: Secondary | ICD-10-CM | POA: Diagnosis not present

## 2021-11-17 DIAGNOSIS — D508 Other iron deficiency anemias: Secondary | ICD-10-CM | POA: Diagnosis not present

## 2021-11-17 DIAGNOSIS — Z113 Encounter for screening for infections with a predominantly sexual mode of transmission: Secondary | ICD-10-CM

## 2021-11-17 DIAGNOSIS — J4520 Mild intermittent asthma, uncomplicated: Secondary | ICD-10-CM

## 2021-11-17 MED ORDER — ALBUTEROL SULFATE HFA 108 (90 BASE) MCG/ACT IN AERS
2.0000 | INHALATION_SPRAY | Freq: Four times a day (QID) | RESPIRATORY_TRACT | 1 refills | Status: AC | PRN
  Filled 2021-12-02: qty 13.4, 50d supply, fill #0

## 2021-11-17 NOTE — Progress Notes (Signed)
Subjective:    Patient ID: Kathryn Cardenas, female    DOB: December 07, 1990, 31 y.o.   MRN: [id number]  HPI The patient comes in today for a wellness visit.    A review of their health history was completed.  A review of medications was also completed.  Any needed refills; Albuterol inhaler  Eating habits: trying to eat better  Falls/  MVA accidents in past few months: none  Regular exercise: tries to twice a week   Specialist pt sees on regular basis: none  Preventative health issues were discussed.   Additional concerns: None   Review of Systems  All other systems reviewed and are negative.      Objective:   Physical Exam Vitals reviewed. Exam conducted with a chaperone present.  Constitutional:      General: She is not in acute distress.    Appearance: Normal appearance. She is normal weight. She is not ill-appearing, toxic-appearing or diaphoretic.  HENT:     Head: Normocephalic and atraumatic.  Cardiovascular:     Rate and Rhythm: Normal rate and regular rhythm.     Pulses: Normal pulses.     Heart sounds: Normal heart sounds. No murmur heard. Pulmonary:     Effort: Pulmonary effort is normal. No respiratory distress.     Breath sounds: Normal breath sounds. No wheezing.  Chest:     Chest wall: No mass, lacerations, deformity, swelling, tenderness, crepitus or edema. There is no dullness to percussion.  Breasts:    Breasts are symmetrical.     Right: Normal. No swelling, bleeding, inverted nipple, mass, nipple discharge, skin change or tenderness.     Left: Normal. No swelling, bleeding, inverted nipple, mass, nipple discharge, skin change or tenderness.  Abdominal:     Hernia: There is no hernia in the left inguinal area or right inguinal area.  Genitourinary:    Exam position: Lithotomy position.     Pubic Area: No rash or pubic lice.      Labia:        Right: No rash, tenderness, lesion or injury.        Left: No rash, tenderness, lesion or injury.       Urethra: No prolapse, urethral pain, urethral swelling or urethral lesion.     Vagina: Normal. No signs of injury and foreign body. No vaginal discharge, erythema, tenderness, bleeding, lesions or prolapsed vaginal walls.     Cervix: No cervical motion tenderness, discharge, friability, lesion, erythema, cervical bleeding or eversion.     Uterus: Normal. Not deviated, not enlarged, not fixed, not tender and no uterine prolapse.      Adnexa: Right adnexa normal and left adnexa normal.       Right: No mass, tenderness or fullness.         Left: No mass, tenderness or fullness.    Musculoskeletal:     Comments: Grossly intact  Lymphadenopathy:     Upper Body:     Right upper body: No supraclavicular, axillary or pectoral adenopathy.     Left upper body: No supraclavicular, axillary or pectoral adenopathy.     Lower Body: No right inguinal adenopathy. No left inguinal adenopathy.  Skin:    General: Skin is warm.     Capillary Refill: Capillary refill takes less than 2 seconds.  Neurological:     Mental Status: She is alert.     Comments: Grossly intact  Psychiatric:        Mood and Affect: Mood normal.  Behavior: Behavior normal.        Assessment & Plan:   1. Well woman exam with routine gynecological exam Adult wellness-complete.wellness physical was conducted today. Importance of diet and exercise were discussed in detail.  Importance of stress reduction and healthy living were discussed.  In addition to this a discussion regarding safety was also covered.  We also reviewed over immunizations and gave recommendations regarding current immunization needed for age.   In addition to this additional areas were also touched on including: Preventative health exams needed:  Colonoscopy not indicated Pap smear updated today Hepatitis C screening up-to-date Influenza vaccine given today COVID-vaccine up-to-date  Patient was advised yearly wellness exam   2. Cervical  cancer screening - IGP,CtNgTv,rfx Aptima HPV ASCU  3. Screening examination for STD (sexually transmitted disease) - HIV antibody (with reflex) - RPR - IGP,CtNgTv,rfx Aptima HPV ASCU  4. Other iron deficiency anemia - CMP14+EGFR - CBC with Differential - Iron, TIBC and Ferritin Panel  5. Mild intermittent asthma without complication -Refill - albuterol (VENTOLIN HFA) 108 (90 Base) MCG/ACT inhaler; Inhale 2 puffs into the lungs every 6 (six) hours as needed.  Dispense: 2 each; Refill: 1

## 2021-11-18 LAB — CMP14+EGFR
ALT: 17 IU/L (ref 0–32)
AST: 19 IU/L (ref 0–40)
Albumin/Globulin Ratio: 1.4 (ref 1.2–2.2)
Albumin: 4.1 g/dL (ref 4.0–5.0)
Alkaline Phosphatase: 41 IU/L — ABNORMAL LOW (ref 44–121)
BUN/Creatinine Ratio: 11 (ref 9–23)
BUN: 8 mg/dL (ref 6–20)
Bilirubin Total: 0.3 mg/dL (ref 0.0–1.2)
CO2: 21 mmol/L (ref 20–29)
Calcium: 9.3 mg/dL (ref 8.7–10.2)
Chloride: 104 mmol/L (ref 96–106)
Creatinine, Ser: 0.73 mg/dL (ref 0.57–1.00)
Globulin, Total: 3 g/dL (ref 1.5–4.5)
Glucose: 87 mg/dL (ref 70–99)
Potassium: 4 mmol/L (ref 3.5–5.2)
Sodium: 140 mmol/L (ref 134–144)
Total Protein: 7.1 g/dL (ref 6.0–8.5)
eGFR: 114 mL/min/{1.73_m2} (ref >59)

## 2021-11-18 LAB — CBC WITH DIFFERENTIAL/PLATELET
Basophils Absolute: 0.1 10*3/uL (ref 0.0–0.2)
Basos: 1 %
EOS (ABSOLUTE): 0.3 10*3/uL (ref 0.0–0.4)
Eos: 5 %
Hematocrit: 39.6 % (ref 34.0–46.6)
Hemoglobin: 12.6 g/dL (ref 11.1–15.9)
Immature Grans (Abs): 0.1 10*3/uL (ref 0.0–0.1)
Immature Granulocytes: 1 %
Lymphocytes Absolute: 2.4 10*3/uL (ref 0.7–3.1)
Lymphs: 35 %
MCH: 30.2 pg (ref 26.6–33.0)
MCHC: 31.8 g/dL (ref 31.5–35.7)
MCV: 95 fL (ref 79–97)
Monocytes Absolute: 0.5 10*3/uL (ref 0.1–0.9)
Monocytes: 8 %
Neutrophils Absolute: 3.4 10*3/uL (ref 1.4–7.0)
Neutrophils: 50 %
Platelets: 296 10*3/uL (ref 150–450)
RBC: 4.17 x10E6/uL (ref 3.77–5.28)
RDW: 12.7 % (ref 11.7–15.4)
WBC: 6.7 10*3/uL (ref 3.4–10.8)

## 2021-11-18 LAB — IRON,TIBC AND FERRITIN PANEL
Ferritin: 28 ng/mL (ref 15–150)
Iron Saturation: 30 % (ref 15–55)
Iron: 108 ug/dL (ref 27–159)
Total Iron Binding Capacity: 359 ug/dL (ref 250–450)
UIBC: 251 ug/dL (ref 131–425)

## 2021-11-18 LAB — HIV ANTIBODY (ROUTINE TESTING W REFLEX): HIV Screen 4th Generation wRfx: NONREACTIVE

## 2021-11-18 LAB — RPR: RPR Ser Ql: NONREACTIVE

## 2021-11-22 ENCOUNTER — Encounter: Payer: Self-pay | Admitting: Nurse Practitioner

## 2021-11-28 LAB — IGP,CTNGTV,RFX APTIMA HPV ASCU
Chlamydia, Nuc. Acid Amp: NEGATIVE
Gonococcus, Nuc. Acid Amp: NEGATIVE
Trich vag by NAA: NEGATIVE

## 2021-11-28 LAB — SPECIMEN STATUS REPORT

## 2021-11-28 LAB — HPV APTIMA: HPV Aptima: NEGATIVE

## 2021-12-02 ENCOUNTER — Other Ambulatory Visit (HOSPITAL_COMMUNITY): Payer: Self-pay

## 2021-12-06 ENCOUNTER — Encounter: Payer: Self-pay | Admitting: *Deleted

## 2022-02-10 ENCOUNTER — Encounter: Payer: Self-pay | Admitting: Family Medicine

## 2022-02-13 ENCOUNTER — Other Ambulatory Visit: Payer: Self-pay

## 2022-02-13 MED ORDER — NORETHINDRONE ACET-ETHINYL EST 1-20 MG-MCG PO TABS: 1.0000 | ORAL_TABLET | Freq: Every day | ORAL | 11 refills | Status: AC

## 2022-11-08 ENCOUNTER — Encounter: Payer: Self-pay | Admitting: Family Medicine

## 2022-11-09 NOTE — Telephone Encounter (Signed)
Nurses-please forward to the patient   dear Patient, Unfortunately it is not as simple as just sending in a prescription the following includes information that should be able to help you please be aware that not all insurances cover these medicines in fact many insurances do not.  Recently [place] stepped away from covering [facility] and Zepbound.  There are some other weaker alternatives but they do not work as well.  But feel free to read through the following information and if you want to talk with the pharmacy benefits portion of your insurance company directly you may do so.  If we can be of more help please let me know.  There are other options that may not work as well but I would recommend a follow-up office visit regarding those thank you-Dr. Lorin Picket   Certainly achieving healthy status is a important goal for all of [place].  Having a healthy weight is only one portion of this process.  The following information lays out our approach toward GLP-1 medications for weight reduction (tradename's-Wegovy, Saxenda).  #1 these medications are just 1 part of a healthy approach toward obtaining and maintaining a healthy weight.  Healthy eating habits are extremely important as is regular physical activity.  If you are contemplating trying this medication it does require effort with both healthy eating and activity. #2-our office has a unified approach to help support those who are on these medications.  The following are requirements within our approach.  A- a dedicated office visit consultation with provider to discuss GLP-1 medications and discuss healthy eating habits.  This will involve submitting a food diary for our review and a willingness on the part of the patient to change eating habits toward healthier habits.  B- a commitment from the patient to engage in regular physical activity which will be laid out by the provider with the patient in a shared discussion.  C-a commitment to following up with  office visits specific for healthy habits/medication management of weight reduction-these office visits will be every 3 months mandatory.   All of these steps above are dependent on your insurance covering these medications.  Please be aware that off label use of Ozempic or Mounjaro for the purposes of weight loss is often not covered by insurance companies unless a person that has diabetes that is not properly controlled by metformin.  #2 for those individuals who are interested in being prescribed medication such as the above it is important to take the following into account. A- these medications are indicated to help with weight reduction when BMI is 30 or more, or BMI 27 or more with at least 1 significant comorbidity (examples-diabetes, hypertension,    dyslipidemia) B- these medications are not covered by all insurances.  Even when a person meets the FDA criteria for these [employer] have written within them exclusions that prevent prescribing these type of medications.  In these situations-if there is a exclusion written within the policy-prior approval or letters from the doctor will not help. C-unfortunately each insurance policy written between a insurance company(such as [employer], [employer]) and the covered person's employer(in other words where you get your insurance from) can be different.  When we prescribe the medication it is sent into the pharmacy then submitted to your insurance company.  There is no way that we can be certain if medication such as Wegovy or Bernie Covey is covered or not until the pharmacy sends in the medication. D- we highly  recommend that you connect with your insurance company to see if these prescriptions are covered by your plan.  Most of the time your insurance company will be straight if you but sometimes they will tell you it is covered when in fact it would get denied when we send it to the pharmacy.  We will do  everything we can to try to help get medications approved but when these are excluded by an insurance policy there is nothing we can do to make an insurance company to cover this medication.  If this medication is covered by your insurance and you desired to get started on this medication we ask that you schedule an office visit specific for this issue (it takes a whole office visit to cover this material.  Also we will not start GLP-1's based on just MyChart messaging.).  When you schedule your visit to discuss the GLP-1 medication the front desk will mail you a food diary to fill out before that visit.  Please be aware that when you come for that visit our discussion will be fully on the medication as well as potential risk and benefits.  Should you have additional questions please let us know. Thanks- [facility]

## 2023-03-08 ENCOUNTER — Other Ambulatory Visit: Payer: Self-pay | Admitting: Family Medicine

## 2023-04-27 ENCOUNTER — Encounter (HOSPITAL_COMMUNITY): Payer: Self-pay

## 2023-04-27 ENCOUNTER — Ambulatory Visit (HOSPITAL_COMMUNITY): Admission: EM | Admit: 2023-04-27 | Discharge: 2023-04-27 | Disposition: A | Discharge status: Home / Self Care

## 2023-04-27 DIAGNOSIS — M79604 Pain in right leg: Secondary | ICD-10-CM

## 2023-04-27 MED ORDER — DICLOFENAC SODIUM 75 MG PO TBEC: 75.0000 mg | DELAYED_RELEASE_TABLET | Freq: Two times a day (BID) | ORAL | 0 refills | Status: AC

## 2023-04-27 MED ORDER — BACLOFEN 20 MG PO TABS: 20.0000 mg | ORAL_TABLET | Freq: Three times a day (TID) | ORAL | 0 refills | Status: AC

## 2023-04-27 NOTE — ED Triage Notes (Signed)
 Patient presenting with right leg pain on and off onset 2 weeks ago. Pain will shoot up the right leg with walking. 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 No known falls or injuries  Prescriptions or OTC medications tried: Yes- Tylenol     with mild relief

## 2023-04-27 NOTE — Discharge Instructions (Addendum)
 1. Right leg pain (Primary) - diclofenac (VOLTAREN) 75 MG EC tablet; Take 1 tablet (75 mg total) by mouth 2 (two) times daily.  Dispense: 30 tablet; Refill: 0 - baclofen (LIORESAL) 20 MG tablet; Take 1 tablet (20 mg total) by mouth 3 (three) times daily.  Dispense: 30 each; Refill: 0 -Take the next couple of days and rest is much as possible minimize ambulation, no heavy lifting or activities that may exacerbate current injury. -If symptoms persist past 1 week follow-up with orthopedics for further evaluation and treatment for ongoing right leg pain. -If symptoms become much worse follow-up in ER for further evaluation and possible need for advanced imaging.

## 2023-04-27 NOTE — ED Provider Notes (Signed)
 [facility]  Note:  This document was prepared using Dragon voice recognition software and may include unintentional dictation errors.  MRN: [id number] DOB: 1990/12/22  Subjective:   Kathryn Cardenas is a 33 y.o. female presenting for generalized right leg pain intermittently x 2 weeks.  Patient reports that pain shoots up the right leg when walking.  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  Patient denies any known injuries or trauma to the leg.  Patient has been taking Tylenol with mild relief but no resolution.  Patient denies any lower back pain, numbness or tingling to the leg, swelling, redness or tenderness.  Patient reports she only feels pain when she is walking or has been standing for long period of time, no pain at rest.  No current facility-administered medications for this encounter.  Current Outpatient Medications:    albuterol (VENTOLIN HFA) 108 (90 Base) MCG/ACT inhaler, Inhale 2 puffs into the lungs every 6 (six) hours as needed., Disp: 13.4 g, Rfl: 1   baclofen (LIORESAL) 20 MG tablet, Take 1 tablet (20 mg total) by mouth 3 (three) times daily., Disp: 30 each, Rfl: 0   diclofenac (VOLTAREN) 75 MG EC tablet, Take 1 tablet (75 mg total) by mouth 2 (two) times daily., Disp: 30 tablet, Rfl: 0   ferrous sulfate 325 (65 FE) MG tablet, Take 325 mg by mouth daily with breakfast., Disp: , Rfl:    Multiple Vitamins-Minerals (MULTIVITAMIN WITH MINERALS) tablet, Take 1 tablet by mouth daily. Woman's Daily, Disp: , Rfl:    norethindrone-ethinyl estradiol (LOESTRIN) 1-20 MG-MCG tablet, Take 1 tablet by mouth daily., Disp: 28 tablet, Rfl: 11   Allergies  Allergen Reactions   Ibuprofen Hives    Past Medical History:  Diagnosis Date   Anemia    Asthma      Past Surgical History:  Procedure Laterality Date   COLONOSCOPY WITH PROPOFOL N/A 08/18/2019   Procedure: COLONOSCOPY WITH PROPOFOL;  Surgeon: Corbin Ade, MD;  Location: [facility];  Service: Endoscopy;  Laterality: N/A;  2:30pm   NO PAST SURGERIES     WISDOM TOOTH EXTRACTION      Family History  Problem Relation Age of Onset   Colon cancer Neg Hx    Gastric cancer Neg Hx    Esophageal cancer Neg Hx     Social History   Tobacco Use   Smoking status: Never   Smokeless tobacco: Never  Vaping Use   Vaping status: Never Used  Substance Use Topics   Alcohol use: Yes    Alcohol/week: 1.0 standard drink of alcohol    Types: 1 Standard drinks or equivalent per week    Comment: social drinker   Drug use: No    ROS Refer to HPI for ROS details.  Objective:   Vitals: BP 133/88 (BP Location: Left Arm)   Pulse 81   Temp 98.3 F (36.8 C) (Oral)   Resp 16   Ht 5\' 10"  (1.778 m)   Wt 253 lb (114.8 kg)   LMP 04/07/2023 (Exact Date)   SpO2 97%   BMI 36.30 kg/m   Physical Exam Vitals and nursing note reviewed.  Constitutional:      General: She is not in acute distress.    Appearance: Normal appearance. She is well-developed. She is not ill-appearing or toxic-appearing.  HENT:     Head: Normocephalic.  Cardiovascular:     Rate and Rhythm:  Normal rate.  Pulmonary:     Effort: Pulmonary effort is normal. No respiratory distress.  Musculoskeletal:     Cervical back: Neck supple.     Right upper leg: No swelling, edema, deformity, tenderness or bony tenderness.     Right knee: No swelling, erythema or bony tenderness. Normal range of motion. No tenderness.     Right lower leg: No swelling, deformity, tenderness or bony tenderness. No edema.  Skin:    General: Skin is warm and dry.     Capillary Refill: Capillary refill takes less than 2 seconds.  Neurological:     General: No focal deficit present.     Mental Status: She is alert and oriented to person, place, and time.  Psychiatric:        Mood and Affect: Mood normal.     Procedures  No results found for this or any previous visit (from the past 24  hours).  Assessment and Plan :   PDMP not reviewed this encounter.  1. Right leg pain    1. Right leg pain (Primary) - diclofenac (VOLTAREN) 75 MG EC tablet; Take 1 tablet (75 mg total) by mouth 2 (two) times daily.  Dispense: 30 tablet; Refill: 0 - baclofen (LIORESAL) 20 MG tablet; Take 1 tablet (20 mg total) by mouth 3 (three) times daily.  Dispense: 30 each; Refill: 0 -Take the next couple of days and rest is much as possible minimize ambulation, no heavy lifting or activities that may exacerbate current injury. -If symptoms persist past 1 week follow-up with orthopedics for further evaluation and treatment for ongoing right leg pain. -If symptoms become much worse follow-up in ER for further evaluation and possible need for advanced imaging.  Lucky Cowboy   [place], [place], [place] 04/27/23 1757

## 2023-11-28 ENCOUNTER — Other Ambulatory Visit (HOSPITAL_BASED_OUTPATIENT_CLINIC_OR_DEPARTMENT_OTHER): Payer: Self-pay

## 2023-11-28 MED ORDER — FLUZONE 0.5 ML IM SUSY
0.5000 mL | PREFILLED_SYRINGE | Freq: Once | INTRAMUSCULAR | 0 refills | Status: AC
  Filled 2023-11-28: qty 0.5, 1d supply, fill #0
